# Patient Record
Sex: Female | Born: 1935 | Race: White | Hispanic: No | Smoking: Never smoker
Health system: Southern US, Community
[De-identification: ages and names within clinical notes are randomized; demographics above are authoritative.]

## PROBLEM LIST (undated history)

## (undated) DIAGNOSIS — M8580 Other specified disorders of bone density and structure, unspecified site: Secondary | ICD-10-CM

## (undated) DIAGNOSIS — S9032XA Contusion of left foot, initial encounter: Secondary | ICD-10-CM

## (undated) DIAGNOSIS — M1990 Unspecified osteoarthritis, unspecified site: Secondary | ICD-10-CM

## (undated) DIAGNOSIS — R296 Repeated falls: Secondary | ICD-10-CM

## (undated) DIAGNOSIS — I10 Essential (primary) hypertension: Secondary | ICD-10-CM

## (undated) DIAGNOSIS — D649 Anemia, unspecified: Secondary | ICD-10-CM

## (undated) DIAGNOSIS — R7303 Prediabetes: Secondary | ICD-10-CM

## (undated) DIAGNOSIS — I272 Pulmonary hypertension, unspecified: Secondary | ICD-10-CM

## (undated) DIAGNOSIS — G2581 Restless legs syndrome: Secondary | ICD-10-CM

## (undated) HISTORY — PX: ABDOMINAL HYSTERECTOMY: SHX81

## (undated) HISTORY — PX: ELBOW SURGERY: SHX618

## (undated) HISTORY — DX: Anemia, unspecified: D64.9

## (undated) HISTORY — DX: Unspecified osteoarthritis, unspecified site: M19.90

## (undated) HISTORY — DX: Essential (primary) hypertension: I10

## (undated) HISTORY — DX: Other specified disorders of bone density and structure, unspecified site: M85.80

## (undated) HISTORY — DX: Pulmonary hypertension, unspecified: I27.20

## (undated) HISTORY — DX: Prediabetes: R73.03

## (undated) HISTORY — DX: Restless legs syndrome: G25.81

## (undated) HISTORY — PX: HYSTERECTOMY: SHX81

## (undated) HISTORY — DX: Age-related physical debility: R54

## (undated) HISTORY — PX: CATARACT EXTRACTION: SUR2

---

## 1989-10-21 ENCOUNTER — Emergency Department: Admit: 1989-10-21 | Disposition: A | Discharge status: Home / Self Care | Payer: Self-pay | Source: Ambulatory Visit

## 1992-06-10 ENCOUNTER — Emergency Department: Admit: 1992-06-10 | Disposition: A | Discharge status: Home / Self Care | Payer: Self-pay | Source: Ambulatory Visit

## 1998-04-19 ENCOUNTER — Ambulatory Visit
Admission: RE | Admit: 1998-04-19 | Disposition: A | Discharge status: Home / Self Care | Payer: Self-pay | Source: Ambulatory Visit

## 1998-04-24 ENCOUNTER — Ambulatory Visit
Admission: RE | Admit: 1998-04-24 | Disposition: A | Discharge status: Home / Self Care | Payer: Self-pay | Source: Ambulatory Visit

## 1998-11-09 ENCOUNTER — Emergency Department
Admission: EM | Admit: 1998-11-09 | Disposition: A | Discharge status: Home / Self Care | Payer: Self-pay | Source: Ambulatory Visit

## 2002-10-25 ENCOUNTER — Ambulatory Visit
Admission: RE | Admit: 2002-10-25 | Disposition: A | Discharge status: Home / Self Care | Payer: Self-pay | Source: Ambulatory Visit

## 2002-10-30 ENCOUNTER — Ambulatory Visit
Admission: RE | Admit: 2002-10-30 | Disposition: A | Discharge status: Home / Self Care | Payer: Self-pay | Source: Ambulatory Visit

## 2003-05-10 ENCOUNTER — Ambulatory Visit
Admission: RE | Admit: 2003-05-10 | Disposition: A | Discharge status: Home / Self Care | Payer: Self-pay | Source: Ambulatory Visit

## 2003-11-19 ENCOUNTER — Ambulatory Visit
Admission: RE | Admit: 2003-11-19 | Disposition: A | Discharge status: Home / Self Care | Payer: Self-pay | Source: Ambulatory Visit

## 2004-11-18 ENCOUNTER — Ambulatory Visit
Admission: RE | Admit: 2004-11-18 | Disposition: A | Discharge status: Home / Self Care | Payer: Self-pay | Source: Ambulatory Visit

## 2004-12-09 ENCOUNTER — Ambulatory Visit
Admission: RE | Admit: 2004-12-09 | Disposition: A | Discharge status: Home / Self Care | Payer: Self-pay | Source: Ambulatory Visit

## 2005-07-09 ENCOUNTER — Emergency Department
Admission: EM | Admit: 2005-07-09 | Disposition: A | Discharge status: Home / Self Care | Payer: Self-pay | Source: Ambulatory Visit

## 2005-07-15 ENCOUNTER — Ambulatory Visit
Admission: RE | Admit: 2005-07-15 | Disposition: A | Discharge status: Home / Self Care | Payer: Self-pay | Source: Ambulatory Visit

## 2005-07-15 HISTORY — PX: WRIST FRACTURE SURGERY: SHX121

## 2006-03-30 ENCOUNTER — Ambulatory Visit
Admission: RE | Admit: 2006-03-30 | Disposition: A | Discharge status: Home / Self Care | Payer: Self-pay | Source: Ambulatory Visit

## 2006-06-02 ENCOUNTER — Ambulatory Visit
Admission: RE | Admit: 2006-06-02 | Disposition: A | Discharge status: Home / Self Care | Payer: Self-pay | Source: Ambulatory Visit

## 2006-06-30 ENCOUNTER — Ambulatory Visit
Admission: RE | Admit: 2006-06-30 | Disposition: A | Discharge status: Home / Self Care | Payer: Self-pay | Source: Ambulatory Visit

## 2007-01-18 ENCOUNTER — Ambulatory Visit
Admission: RE | Admit: 2007-01-18 | Disposition: A | Discharge status: Home / Self Care | Payer: Self-pay | Source: Ambulatory Visit

## 2007-06-02 ENCOUNTER — Ambulatory Visit
Admission: RE | Admit: 2007-06-02 | Disposition: A | Discharge status: Home / Self Care | Payer: Self-pay | Source: Ambulatory Visit

## 2007-06-08 ENCOUNTER — Ambulatory Visit
Admission: RE | Admit: 2007-06-08 | Disposition: A | Discharge status: Home / Self Care | Payer: Self-pay | Source: Ambulatory Visit

## 2007-06-20 ENCOUNTER — Ambulatory Visit
Admission: RE | Admit: 2007-06-20 | Disposition: A | Discharge status: Home / Self Care | Payer: Self-pay | Source: Ambulatory Visit

## 2007-08-31 ENCOUNTER — Ambulatory Visit
Admission: RE | Admit: 2007-08-31 | Disposition: A | Discharge status: Home / Self Care | Payer: Self-pay | Source: Ambulatory Visit

## 2007-09-19 ENCOUNTER — Ambulatory Visit
Admission: RE | Admit: 2007-09-19 | Disposition: A | Discharge status: Home / Self Care | Payer: Self-pay | Source: Ambulatory Visit

## 2007-10-13 ENCOUNTER — Ambulatory Visit
Admission: RE | Admit: 2007-10-13 | Disposition: A | Discharge status: Home / Self Care | Payer: Self-pay | Source: Ambulatory Visit

## 2008-08-07 ENCOUNTER — Ambulatory Visit
Admission: RE | Admit: 2008-08-07 | Disposition: A | Discharge status: Home / Self Care | Payer: Self-pay | Source: Ambulatory Visit

## 2009-07-25 ENCOUNTER — Ambulatory Visit
Admission: RE | Admit: 2009-07-25 | Disposition: A | Discharge status: Home / Self Care | Payer: Self-pay | Source: Ambulatory Visit

## 2010-01-30 ENCOUNTER — Ambulatory Visit
Admission: RE | Admit: 2010-01-30 | Disposition: A | Discharge status: Home / Self Care | Payer: Self-pay | Source: Ambulatory Visit

## 2010-10-08 ENCOUNTER — Ambulatory Visit
Admission: RE | Admit: 2010-10-08 | Disposition: A | Discharge status: Home / Self Care | Payer: Self-pay | Source: Ambulatory Visit

## 2012-02-16 ENCOUNTER — Ambulatory Visit
Admission: RE | Admit: 2012-02-16 | Disposition: A | Discharge status: Home / Self Care | Payer: Self-pay | Source: Ambulatory Visit

## 2012-02-21 ENCOUNTER — Emergency Department
Admission: EM | Admit: 2012-02-21 | Disposition: A | Discharge status: Home / Self Care | Payer: Self-pay | Source: Ambulatory Visit

## 2012-04-13 ENCOUNTER — Emergency Department
Admission: EM | Admit: 2012-04-13 | Disposition: A | Discharge status: Home / Self Care | Payer: Self-pay | Source: Ambulatory Visit

## 2012-06-02 ENCOUNTER — Emergency Department
Admission: EM | Admit: 2012-06-02 | Disposition: A | Discharge status: Home / Self Care | Payer: Self-pay | Source: Ambulatory Visit

## 2012-06-21 ENCOUNTER — Ambulatory Visit
Admission: RE | Admit: 2012-06-21 | Disposition: A | Discharge status: Home / Self Care | Payer: Self-pay | Source: Ambulatory Visit

## 2012-07-01 ENCOUNTER — Ambulatory Visit
Admission: RE | Admit: 2012-07-01 | Disposition: A | Discharge status: Home / Self Care | Payer: Self-pay | Source: Ambulatory Visit

## 2012-08-04 ENCOUNTER — Emergency Department
Admission: EM | Admit: 2012-08-04 | Disposition: A | Discharge status: Home / Self Care | Payer: Self-pay | Source: Ambulatory Visit

## 2012-11-09 LAB — COMPREHENSIVE METABOLIC PANEL
ALT: 16 U/L (ref 0–55)
AST (SGOT): 23 U/L (ref 10–42)
Albumin/Globulin Ratio: 1.09 Ratio (ref 0.70–1.50)
Albumin: 3.6 gm/dL (ref 3.5–5.0)
Alkaline Phosphatase: 82 U/L (ref 40–145)
Anion Gap: 16.5 Gap — ABNORMAL HIGH (ref 7.0–16.0)
BUN / Creatinine Ratio: 18.2 Ratio (ref 10.0–30.0)
BUN: 12 mg/dL (ref 7–22)
Bilirubin, Total: 1.4 mg/dL — ABNORMAL HIGH (ref 0.1–1.2)
CO2: 26 mMol/L (ref 20.0–30.0)
Calcium: 10.1 mg/dL (ref 8.5–10.5)
Chloride: 93 mMol/L — ABNORMAL LOW (ref 98–110)
Creatinine: 0.66 mg/dL (ref 0.60–1.20)
EGFR: >60 mL/min/{1.73_m2}
Globulin: 3.3 gm/dL (ref 2.0–4.0)
Glucose: 113 mg/dL — ABNORMAL HIGH (ref 70–99)
Osmolality Calc: 265 mOsm/kg — ABNORMAL LOW (ref 275–300)
Potassium: 3.5 mMol/L (ref 3.5–5.3)
Protein, Total: 6.9 gm/dL (ref 6.0–8.3)
Sodium: 132 mMol/L — ABNORMAL LOW (ref 136–147)

## 2012-11-09 LAB — CBC AND DIFFERENTIAL
Basophils %: 0.5 % (ref 0.0–3.0)
Basophils Absolute: 0 10*3/uL (ref 0.0–0.3)
Eosinophils %: 0.1 % (ref 0.0–7.0)
Eosinophils Absolute: 0 10*3/uL (ref 0.0–0.8)
Hematocrit: 38.7 % (ref 36.0–48.0)
Hemoglobin: 13 gm/dL (ref 12.0–16.0)
Lymphocytes Absolute: 1 10*3/uL (ref 0.6–5.1)
Lymphocytes: 16.6 % (ref 15.0–46.0)
MCH: 33 pg (ref 28–35)
MCHC: 34 gm/dL (ref 33–37)
MCV: 97 fL (ref 80–100)
MPV: 8.7 fL (ref 8.0–12.0)
Monocytes Absolute: 0.6 10*3/uL (ref 0.1–1.7)
Monocytes: 9.9 % (ref 3.0–15.0)
Neutrophils %: 72.9 % (ref 42.0–78.0)
Neutrophils Absolute: 4.3 10*3/uL (ref 1.7–8.6)
PLT CT: 281 10*3/uL (ref 130–440)
RBC: 3.99 10*6/uL (ref 3.80–5.00)
RDW: 13.5 % (ref 12.0–15.0)
WBC: 5.9 10*3/uL (ref 4.0–11.0)

## 2013-01-31 ENCOUNTER — Ambulatory Visit
Admission: RE | Admit: 2013-01-31 | Disposition: A | Discharge status: Home / Self Care | Payer: Self-pay | Source: Ambulatory Visit | Attending: Family Medicine | Admitting: Family Medicine

## 2013-03-08 ENCOUNTER — Encounter (INDEPENDENT_AMBULATORY_CARE_PROVIDER_SITE_OTHER): Payer: Self-pay | Admitting: Orthopaedic Surgery

## 2013-08-23 ENCOUNTER — Emergency Department: Payer: Medicare Other

## 2013-08-23 ENCOUNTER — Emergency Department
Admission: EM | Admit: 2013-08-23 | Discharge: 2013-08-23 | Disposition: A | Discharge status: Home / Self Care | Payer: Medicare Other | Attending: Emergency Medicine | Admitting: Emergency Medicine

## 2013-08-23 DIAGNOSIS — R55 Syncope and collapse: Secondary | ICD-10-CM | POA: Insufficient documentation

## 2013-08-23 DIAGNOSIS — M1990 Unspecified osteoarthritis, unspecified site: Secondary | ICD-10-CM | POA: Insufficient documentation

## 2013-08-23 DIAGNOSIS — I10 Essential (primary) hypertension: Secondary | ICD-10-CM | POA: Insufficient documentation

## 2013-08-23 LAB — ECG 12-LEAD
P Wave Axis: 73 deg
P Wave Duration: 140 ms
P-R Interval: 216 ms
Patient Age: 89 years
Q-T Dispersion: 40 ms
Q-T Interval(Corrected): 409 ms
Q-T Interval: 364 ms
QRS Axis: 45 deg
QRS Duration: 82 ms
T Axis: 68 deg
Ventricular Rate: 76 /min

## 2013-08-23 LAB — COMPREHENSIVE METABOLIC PANEL
ALT: 9 U/L (ref 0–55)
AST (SGOT): 21 U/L (ref 10–42)
Albumin/Globulin Ratio: 0.95 Ratio (ref 0.70–1.50)
Albumin: 3.3 gm/dL — ABNORMAL LOW (ref 3.5–5.0)
Alkaline Phosphatase: 83 U/L (ref 40–145)
Anion Gap: 14.1 mMol/L (ref 7.0–18.0)
BUN / Creatinine Ratio: 15.6 Ratio (ref 10.0–30.0)
BUN: 10 mg/dL (ref 7–22)
Bilirubin, Total: 1 mg/dL (ref 0.1–1.2)
CO2: 26 mMol/L (ref 20.0–30.0)
Calcium: 10.2 mg/dL (ref 8.5–10.5)
Chloride: 99 mMol/L (ref 98–110)
Creatinine: 0.64 mg/dL (ref 0.60–1.20)
EGFR: >60 mL/min/{1.73_m2}
Globulin: 3.5 gm/dL (ref 2.0–4.0)
Glucose: 101 mg/dL — ABNORMAL HIGH (ref 70–99)
Osmolality Calc: 269 mOsm/kg — ABNORMAL LOW (ref 275–300)
Potassium: 4.1 mMol/L (ref 3.5–5.3)
Protein, Total: 6.8 gm/dL (ref 6.0–8.3)
Sodium: 135 mMol/L — ABNORMAL LOW (ref 136–147)

## 2013-08-23 LAB — CBC AND DIFFERENTIAL
Basophils %: 0.6 % (ref 0.0–3.0)
Basophils Absolute: 0 10*3/uL (ref 0.0–0.3)
Eosinophils %: 0.5 % (ref 0.0–7.0)
Eosinophils Absolute: 0 10*3/uL (ref 0.0–0.8)
Hematocrit: 34.4 % — ABNORMAL LOW (ref 36.0–48.0)
Hemoglobin: 11.2 gm/dL — ABNORMAL LOW (ref 12.0–16.0)
Lymphocytes Absolute: 1.2 10*3/uL (ref 0.6–5.1)
Lymphocytes: 24.2 % (ref 15.0–46.0)
MCH: 30 pg (ref 28–35)
MCHC: 33 gm/dL (ref 33–37)
MCV: 93 fL (ref 80–100)
MPV: 9.1 fL (ref 8.0–12.0)
Monocytes Absolute: 0.5 10*3/uL (ref 0.1–1.7)
Monocytes: 10.4 % (ref 3.0–15.0)
Neutrophils %: 64.3 % (ref 42.0–78.0)
Neutrophils Absolute: 3.1 10*3/uL (ref 1.7–8.6)
PLT CT: 300 10*3/uL (ref 130–440)
RBC: 3.7 10*6/uL — ABNORMAL LOW (ref 3.80–5.00)
RDW: 13 % (ref 12.0–15.0)
WBC: 4.8 10*3/uL (ref 4.0–11.0)

## 2013-08-23 LAB — VH CARDIAC PROF.WITH TROPONIN
Creatine Kinase (CK): 49 U/L (ref 30–189)
Creatinine Kinase MB (CKMB): 1.1 ng/mL (ref 0.1–6.0)
Troponin I: 0 ng/mL (ref 0.00–0.02)

## 2013-08-23 NOTE — ED Provider Notes (Signed)
Physician/Midlevel provider first contact with patient: 08/23/13 1037         History     Chief Complaint   Patient presents with   . Fall     HPI  This is a 79 yo female that presents after a fall on Sunday.  She states she fell suddenly without symptoms of dizziness, palpitations or shortness of breath before it.  She has not been sick recently or had any symptoms of UTI like dysuria.  She states she just fell for no reason.  She has never done that before.  She felt about a year ago but it was because she missed a stair.  No fevers or chest pain.  No neck pain.  She just has a mild headache with occipital pain.        Past Medical History   Diagnosis Date   . Arthritis    . Hypertension        Past Surgical History   Procedure Date   . Hysterectomy    . Elbow surgery        Family History   Problem Relation Age of Onset   . Arthritis Mother        Social  History   Substance Use Topics   . Smoking status: Never Smoker    . Smokeless tobacco: Not on file   . Alcohol Use: No       .     No Known Allergies    Current/Home Medications    ASPIRIN EC 81 MG EC TABLET    Take 81 mg by mouth daily.    UNKNOWN TO PATIENT            Review of Systems   Constitutional: Negative.  Negative for fever, chills, activity change and fatigue.   HENT: Negative.    Eyes: Negative.    Respiratory: Negative.  Negative for cough and wheezing.    Cardiovascular: Negative.  Negative for chest pain and palpitations.   Gastrointestinal: Negative.  Negative for nausea, vomiting, abdominal pain, constipation and blood in stool.   Genitourinary: Negative.  Negative for dysuria, hematuria and difficulty urinating.   Musculoskeletal: Negative.  Negative for back pain, gait problem and myalgias.   Skin: Negative.  Negative for rash and wound.   Neurological: Negative.  Negative for dizziness, syncope, weakness, numbness and headaches.   Hematological: Negative.    Psychiatric/Behavioral: Negative.    All other systems reviewed and are  negative.        Physical Exam    BP 133/70  Pulse 85  Temp 97.3 F (36.3 C)  Resp 20  SpO2 99%    Physical Exam   Nursing note and vitals reviewed.  Constitutional: She is oriented to person, place, and time. Vital signs are normal. She appears well-developed and well-nourished. She is active. No distress.   HENT:   Head: Normocephalic and atraumatic.   Right Ear: Hearing, tympanic membrane, external ear and ear canal normal.   Left Ear: Hearing, tympanic membrane, external ear and ear canal normal.   Nose: Nose normal.   Mouth/Throat: Uvula is midline, oropharynx is clear and moist and mucous membranes are normal.   Eyes: Conjunctivae normal and EOM are normal. Pupils are equal, round, and reactive to light.   Neck: Normal range of motion and full passive range of motion without pain. Neck supple. Carotid bruit is not present.   Cardiovascular: Normal rate, regular rhythm, normal heart sounds and intact distal pulses.  No murmur heard.  Pulmonary/Chest: Effort normal and breath sounds normal. No respiratory distress. She has no wheezes. She has no rales.   Abdominal: Soft. Normal appearance and bowel sounds are normal. There is no hepatosplenomegaly. There is no tenderness.   Musculoskeletal: Normal range of motion. She exhibits no tenderness.   Neurological: She is alert and oriented to person, place, and time. She has normal reflexes. She displays normal reflexes. No cranial nerve deficit or sensory deficit. She exhibits normal muscle tone. Coordination normal.   Skin: Skin is warm and dry. No rash noted. She is not diaphoretic.   Psychiatric: She has a normal mood and affect. Her speech is normal and behavior is normal.       MDM and ED Course     ED Medication Orders     None           MDM      Procedures  EKG: 76 bpm NSR  Ct Head Wo Contrast    08/23/2013  Stable mild chronic microvascular ischemic changes noted. No CT evidence of acute infarct or hemorrhage. No evidence of traumatic injury.   [email or web address]     Xr Chest Ap Portable    08/23/2013  No evidence of CHF or pneumonia. No pneumothorax or rib fracture identified.  [email or web address]     Results     Procedure Component Value Units Date/Time    Cardiac Prof.with Troponin [[id number]] Collected:08/23/13 1112    Specimen Information:Plasma Updated:08/23/13 1157     Creatine Kinase (CK) 49 U/L      CKMB Index NI %      Creatinine Kinase MB (CKMB) 1.1 ng/mL      Troponin I 0.00 ng/mL     Comprehensive metabolic panel [[id number]]  (Abnormal) Collected:08/23/13 1112    Specimen Information:Blood / Plasma Updated:08/23/13 1150     Sodium 135 (L) mMol/L      Potassium 4.1 mMol/L      Chloride 99 mMol/L      CO2 26.0 mMol/L      CALCIUM 10.2 mg/dL      Glucose 528 (H) mg/dL      Creatinine 4.13 mg/dL      BUN 10 mg/dL      Protein, Total 6.8 gm/dL      Albumin 3.3 (L) gm/dL      Alkaline Phosphatase 83 U/L      ALT 9 U/L      AST (SGOT) 21 U/L      Bilirubin, Total 1.0 mg/dL      Albumin/Globulin Ratio 0.95 Ratio      Anion Gap 14.1 mMol/L      BUN/Creatinine Ratio 15.6 Ratio      EGFR >60 mL/min/1.28m2      Osmolality Calc 269 (L) mOsm/kg      Globulin 3.5 gm/dL     CBC and differential [[id number]]  (Abnormal) Collected:08/23/13 1112    Specimen Information:Blood / Blood Updated:08/23/13 1129     WBC 4.8 K/cmm      RBC 3.70 (L) M/cmm      Hemoglobin 11.2 (L) gm/dL      Hematocrit 53.6 (L) %      MCV 93 fL      MCH 30 pg      MCHC 33 gm/dL      RDW 64.4 %      PLT CT 300 K/cmm      MPV 9.1 fL      NEUTROPHIL % 64.3 %  Lymphocytes 24.2 %      Monocytes 10.4 %      Eosinophils % 0.5 %      Basophils % 0.6 %      Neutrophils Absolute 3.1 K/cmm      Lymphocytes Absolute 1.2 K/cmm      Monocytes Absolute 0.5 K/cmm      Eosinophils Absolute 0.0 K/cmm      BASO Absolute 0.0 K/cmm             Clinical Impression & Disposition     Clinical Impression  Final diagnoses:   Drop attack   Fall, sequela    1245:patient has declined being admitted to  the hospital.  She declined 3 times.  She understands the risks of going home without a workup.  At this time, patient is stable and her labs were normal.  Will send her home under the advisement of f/u with her PCP on Friday.    I reviewed the patients orthostatics, and they were normal.      ED Disposition     Discharge Darrick Meigs discharge to home/self care.    Condition at disposition: Stable             New Prescriptions    No medications on file                 Salome Spotted, DO  08/23/13 1423

## 2013-08-23 NOTE — Discharge Instructions (Signed)
Diagnosing Syncope  Syncope is loss of consciousness (fainting). Your doctor will ask you about your fainting episode and health history. You will also be examined. You may need one or more tests.  Health History and Exam  You may be asked about:   Where and when you fainted, and how long you were unconscious   How you felt just before and right after you fainted   Any family history of heart disease or fainting   Any cardiac or neurological problems you may have   Any medications you may be taking  Your doctor will examine you. Your doctor may:   Check your blood pressure several times   Listen for any heart murmurs or abnormal heartbeats   Examine your eyes, reflexes, and limb movement  Tests  You may need one of more of the following tests:   Electrocardiogram (ECG). This can help your doctor find a slow or a fast heartbeat.   Holter monitoring. You wear a portable ECG monitor for 24 hours. It records your heartbeat.   Event monitoring.You wear a portable ECG monitor for several weeks. It records your heartbeat.   Echocardiogram. This test takes pictures of your heart. It can show heart valve or heart function problems. Or it can reveal damage from a heart attack.   Electrophysiology studies (EPS).These help your doctor find weak or damaged electrical pathways that make your heart beat too fast or slow. This helps your doctor find the cause of a heart rate problem and decide how to treat it.   Tilt table testing. Tilt table testing helps show if changes in your body position affect your heart rate and blood pressure.   2000-2014 [employer], [address], [place] [address]. All rights reserved. This information is not intended as a substitute for professional medical care. Always follow your healthcare professional's instructions.        Near-Fainting:Uncertain Cause  Fainting (syncope) is a temporary loss of consciousness ("passing out"). It occurs when blood flow to the brain is  reduced. Near-fainting ("near-syncope") is like fainting, but you do not fully "pass out."  The common minor causes of near fainting include sudden fear, pain, emotional stress, overexertion, or quickly standing up after sitting or lying for a long time.  The more serious causes for near fainting are due to either a very slow or very fast heart beat, dehydration, anemia, blood loss, problems related to the heart, or taking too much high blood pressure medicine.  The exact cause of your episode is not certain. More tests may be required. Therefore, it is important that you follow up with your doctor as advised.  Home Care:  1) Rest today. Resume your normal activities as soon as you are feeling back to normal.  2) If you become light-headed or dizzy, lie down right away or sit with your head between your knees.  3) Because we do not know the exact cause of your near fainting spell, another spell could occur without warning. Therefore, do not drive a car or use dangerous equipment. D o not take a bath alone (use a shower instead). Do not swim alone. You can resume these activities when your doctor says that you are no longer in danger of having a near fainting spell.  4) Stay well hydrated by drinking enough fluid each day.  Follow Up  with your doctor as instructed.  Get Prompt Medical Attention  if any of the following occur:  -- Another fainting spell   occurs, and it is not explained by the common causes listed above  -- Chest, arm, neck, jaw, back or abdominal pain  -- Shortness of breath  -- Weakness, tingling or numbness in one side of the face, one arm or leg  -- Slurred speech, confusion, trouble walking or seeing  -- Seizure  -- Blood in vomit, stools (black or red color)  -- (In women) unexpected vaginal bleeding   2000-2014 [employer], [address], [place] [address]. All rights reserved. This information is not intended as a substitute for professional medical care. Always follow your  healthcare professional's instructions.

## 2013-08-23 NOTE — ED Provider Notes (Signed)
I have reviewed the notes, assessments, and/or procedures performed by Stacy Gardner, DO, I concur with her/his documentation of Carla Cannon.    I have seen and examined this patient personally.      Carla Ducking, MD  08/23/13 [phone]

## 2014-03-23 ENCOUNTER — Encounter (INDEPENDENT_AMBULATORY_CARE_PROVIDER_SITE_OTHER): Payer: Self-pay | Admitting: Orthopaedic Surgery

## 2014-03-23 ENCOUNTER — Ambulatory Visit (INDEPENDENT_AMBULATORY_CARE_PROVIDER_SITE_OTHER): Payer: Medicare Other | Admitting: Orthopaedic Surgery

## 2014-03-23 VITALS — BP 129/69 | HR 79

## 2014-03-23 DIAGNOSIS — M19012 Primary osteoarthritis, left shoulder: Secondary | ICD-10-CM

## 2014-03-23 DIAGNOSIS — M19019 Primary osteoarthritis, unspecified shoulder: Secondary | ICD-10-CM

## 2014-03-23 MED ORDER — BETAMETHASONE SOD PHOS & ACET 6 (3-3) MG/ML IJ SUSP
12.0000 mg | Freq: Once | INTRAMUSCULAR | Status: DC
Start: ? — End: 2014-03-23
  Administered 2014-03-23: 12 mg via INTRA_ARTICULAR

## 2014-03-23 MED ORDER — LIDOCAINE HCL (PF) 1 % IJ SOLN
2.0000 mL | Freq: Once | INTRAMUSCULAR | Status: DC
Start: ? — End: 2014-03-23
  Administered 2014-03-23: 10:00:00 2 mL via INTRA_ARTICULAR

## 2014-03-23 NOTE — Progress Notes (Signed)
Progress Note      Chief Complaint   Patient presents with   . Shoulder Pain     bilateral L>R       Subjective/HPI Comments:    Patient is a  80 y.o. female who presents with bilateral shoulder pain, L>R. She was last seen for this 2.11.2014 and received Injection betamethasone 12 mg with lidocaine 2ml left shoulder,.      Date of Injury: na  Date Last Seen:  2.11.2014 bilateral shoulder pain L injection    Pain level today on a scale of 0-10, with 10 being the highest, is     Pain Quality:    __x_ Aching    ___ Burning    ___ Shooting    ___ Stabbing    ___ Other:     Pain Course:       _x__ Constant    ___ Intermittent    x___ Fluctuating    ___ Improving    ___ Worsening    Associated Symptoms:        ___ Numbness      _x__ Weakness      _x__ Decreased Range of Motion      _x__ Stiffness      _x__ Pain with Activity      __x_ Night Pain      ___ Locking and Catching      ___ Other:    Previous Treatment:      ___ Rest      ___ Ice      __x_ Heat      _x__ Tylenol      ___ Anti-inflammatory medication       ___ Other:    Patient lives with self, good support from family  Patient works at    Outpatient Prescriptions Marked as Taking for the 03/23/14 encounter (Office Visit) with Berniece Pap, MD   Medication Sig Dispense Refill   . lisinopril-hydrochlorothiazide (PRINZIDE,ZESTORETIC) 20-12.5 MG per tablet     3   . rOPINIRole (REQUIP) 0.5 MG tablet Take 1 mg by mouth nightly.          Marland Kitchen UNKNOWN TO PATIENT          Current Facility-Administered Medications for the 03/23/14 encounter (Office Visit) with Berniece Pap, MD   Medication Dose Route Frequency Provider Last Rate Last Dose   . [COMPLETED] betamethasone acetate-betamethasone sodium phosphate (CELESTONE) injection 12 mg  12 mg Intra-articular Once Berniece Pap, MD   12 mg at 03/23/14 0931   . [COMPLETED] lidocaine (XYLOCAINE) 1 % injection 2 mL  2 mL Intra-articular Once Berniece Pap, MD   2 mL at 03/23/14 0932       Review of Systems:  Review of  Systems   HENT: Positive for hearing loss.    Eyes: Positive for visual disturbance.   Respiratory: Negative for shortness of breath.    Cardiovascular: Negative for chest pain, palpitations and leg swelling.   Gastrointestinal: Negative for abdominal pain and diarrhea.   Genitourinary: Negative for urgency.   Musculoskeletal: Negative for joint swelling and gait problem.   Neurological: Negative for seizures, syncope and headaches.   Psychiatric/Behavioral: Negative for dysphoric mood. The patient is nervous/anxious.             Information above has been gathered by office staff and reviewed and amended by me as needed.  The following portions of the patient's history were reviewed and updated as appropriate: allergies, current medications, past  family history, past medical history, past social history, past surgical history, and problem list.    Objective/ Physical Exam:    BP 129/69   Pulse 79   Patient is alert and oriented.   The skin at the affected area is intact   Pain with any motion of the left shoulder  Stable      Assessment:    1. Arthritis of left shoulder region        Plan:    Shoulder Injection Note    Patient's name and date of birth were verified, site of injection was verified, verified that all of the correct equipment was in the room, and consent was obtained.  A "Time Out" was performed to confirm the above.   The patient was positioned supine with the assistant holding the forearm in the vertical position.  The left shoulder was prepped with betadine. The needle position was confirmed by gentle shoulder rotation during the injection.  2 mL of Lidocaine and 12 mg of Betamethasone was injected 1 cm distal and 1 cm lateral to the coracoid process with the needle directed to the intra-articular space under sterile technique, with no complications.          The patient has been informed of all ordered tests and/or consults, if applicable.  All patient concerns and questions have been addressed.     Work Status:  Follow up: 3 weeks for the other side

## 2014-04-17 ENCOUNTER — Encounter (INDEPENDENT_AMBULATORY_CARE_PROVIDER_SITE_OTHER): Payer: Self-pay | Admitting: Orthopaedic Surgery

## 2014-04-17 ENCOUNTER — Ambulatory Visit (INDEPENDENT_AMBULATORY_CARE_PROVIDER_SITE_OTHER): Payer: Medicare Other | Admitting: Orthopaedic Surgery

## 2014-04-17 VITALS — BP 143/72 | HR 86

## 2014-04-17 DIAGNOSIS — M19011 Primary osteoarthritis, right shoulder: Secondary | ICD-10-CM

## 2014-04-17 DIAGNOSIS — M19012 Primary osteoarthritis, left shoulder: Secondary | ICD-10-CM

## 2014-04-17 DIAGNOSIS — M19019 Primary osteoarthritis, unspecified shoulder: Secondary | ICD-10-CM

## 2014-04-17 MED ORDER — BETAMETHASONE SOD PHOS & ACET 6 (3-3) MG/ML IJ SUSP
12.0000 mg | Freq: Once | INTRAMUSCULAR | Status: DC
Start: ? — End: 2014-04-17
  Administered 2014-04-17: 12 mg via INTRA_ARTICULAR

## 2014-04-17 MED ORDER — LIDOCAINE HCL (PF) 1 % IJ SOLN
2.0000 mL | Freq: Once | INTRAMUSCULAR | Status: DC
Start: ? — End: 2014-04-17
  Administered 2014-04-17: 2 mL via INTRA_ARTICULAR

## 2014-04-17 NOTE — Progress Notes (Signed)
Progress Note      Chief Complaint   Patient presents with   . Shoulder Pain     right       Subjective/HPI Comments:    Patient is a  80 y.o. female who presents with right shoulder pain.  She has a constant pain in her right shoulder and is here today to discuss an injection.   On 03/23/2014 she was here and had an injection of 2 mL of Lidocaine and 12 mg of Betamethasone in her left shoulder.     Date of Injury:  Date Last Seen:03/23/2014.    Pain level today on a scale of 0-10, with 10 being the highest, is 4.    Patient lives with self, has support from her family.   Patient does not work.    Outpatient Prescriptions Marked as Taking for the 04/17/14 encounter (Office Visit) with Berniece Pap, MD   Medication Sig Dispense Refill   . aspirin EC 81 MG EC tablet Take 81 mg by mouth daily.     Marland Kitchen gabapentin (NEURONTIN) 100 MG capsule   0   . lisinopril-hydrochlorothiazide (PRINZIDE,ZESTORETIC) 20-12.5 MG per tablet   3   . nystatin (NYSTOP) 100000 UNIT/GM Powder   1   . rOPINIRole (REQUIP) 0.5 MG tablet Take 1 mg by mouth nightly.        Marland Kitchen UNKNOWN TO PATIENT          Review of Systems:  HENT: Positive for hearing loss.    Eyes: Positive for visual disturbance.   Respiratory: Negative for shortness of breath.    Cardiovascular: Negative for chest pain, palpitations and leg swelling.   Gastrointestinal: Negative for abdominal pain and diarrhea.   Genitourinary: Negative for urgency.   Musculoskeletal: Negative for joint swelling and gait problem.   Neurological: Negative for seizures, syncope and headaches.     Patients states she is "shaky" today.   Psychiatric/Behavioral: Negative for dysphoric mood. The patient is nervous/anxious    Information above has been gathered by office staff and reviewed and amended by me as needed.  The following portions of the patient's history were reviewed and updated as appropriate: allergies, current medications, past family history, past medical history, past social history, past  surgical history, and problem list.    Objective/ Physical Exam:    BP 143/72 mmHg  Pulse 86  Patient is alert and oriented.   The skin at the affected area is intact.  She can reach the top of the head with both hands with only minor discomfort, a real improvement on the injected left side.  Flexion 135, abduction 80, internal rotation thumb to the pelvic crest on the right side    Assessment:    1. Arthritis of left shoulder region    2. Arthritis of right shoulder region        Plan:    Shoulder Injection Note    Patient's name and date of birth were verified, site of injection was verified, verified that all of the correct equipment was in the room, and consent was obtained.  A "Time Out" was performed to confirm the above.   The patient was positioned supine with the assistant holding the forearm in the vertical position.  The right shoulder was prepped with betadine. The needle position was confirmed by gentle shoulder rotation during the injection.  2 mL of Lidocaine and 12 mg of Betamethasone was injected 1 cm distal and 1 cm lateral to the coracoid process  with the needle directed to the intra-articular space under sterile technique, with no complications.      The patient has been informed of all ordered tests and/or consults, if applicable.  All patient concerns and questions have been addressed.    Work Status:  Follow up: prn

## 2014-04-23 ENCOUNTER — Emergency Department
Admission: EM | Admit: 2014-04-23 | Discharge: 2014-04-23 | Disposition: A | Discharge status: Home / Self Care | Payer: Medicare Other | Attending: Emergency Medicine | Admitting: Emergency Medicine

## 2014-04-23 ENCOUNTER — Emergency Department: Payer: Medicare Other

## 2014-04-23 DIAGNOSIS — H9190 Unspecified hearing loss, unspecified ear: Secondary | ICD-10-CM | POA: Insufficient documentation

## 2014-04-23 DIAGNOSIS — H9209 Otalgia, unspecified ear: Secondary | ICD-10-CM | POA: Insufficient documentation

## 2014-04-23 DIAGNOSIS — H9192 Unspecified hearing loss, left ear: Secondary | ICD-10-CM

## 2014-04-23 DIAGNOSIS — H9202 Otalgia, left ear: Secondary | ICD-10-CM

## 2014-04-23 MED ORDER — OFLOXACIN 0.3 % OT SOLN
5.0000 [drp] | Freq: Two times a day (BID) | OTIC | Status: AC
Start: 2014-04-23 — End: 2014-05-03

## 2014-04-23 NOTE — ED Provider Notes (Signed)
Physician/Midlevel provider first contact with patient: 04/23/14 1125         History     Chief Complaint   Patient presents with   . Otalgia     This patient started on Saturday with pain in her left ear and decreased hearing. There is no dizziness there is no buzzing. There's been no drainage. There's been no trauma. She denies any URI runny nose or sore throat. There is no pain to open and close her mouth. There is no dental pain.        Past Medical History   Diagnosis Date   . Arthritis    . Hypertension    . Osteopenia    . Arthritis    . Osteoarthritis    . Pulmonary HTN    . Prediabetes    . RLS (restless legs syndrome)    . Anemia        Past Surgical History   Procedure Laterality Date   . Hysterectomy     . Elbow surgery     . Cataract extraction     . Wrist fracture surgery Left 10.4.2006       Family History   Problem Relation Age of Onset   . Arthritis Mother        Social  History   Substance Use Topics   . Smoking status: Never Smoker    . Smokeless tobacco: Not on file   . Alcohol Use: No       .     Allergies   Allergen Reactions   . Cephalosporins Photosensitivity   . Ciprocin-Fluocin-Procin [Fluocinolone] Photosensitivity   . Codeine Nausea Only   . Penicillins Nausea Only   . Ketoconazole Rash       Current/Home Medications    ASPIRIN EC 81 MG EC TABLET    Take 81 mg by mouth daily.    GABAPENTIN (NEURONTIN) 100 MG CAPSULE        LISINOPRIL-HYDROCHLOROTHIAZIDE (PRINZIDE,ZESTORETIC) 20-12.5 MG PER TABLET        NYSTATIN (NYSTOP) 100000 UNIT/GM POWDER        ROPINIROLE (REQUIP) 0.5 MG TABLET    Take 1 mg by mouth nightly.       UNKNOWN TO PATIENT            Review of Systems   Constitutional: Negative for fever.   HENT: Positive for ear pain and hearing loss. Negative for congestion, ear discharge, postnasal drip, rhinorrhea, sinus pressure, tinnitus and trouble swallowing.    Respiratory: Negative for cough.    Neurological: Negative for dizziness.       Physical Exam    BP: 120/71 mmHg, Heart  Rate: 97, Temp: 97.5 F (36.4 C), Resp Rate: 18, SpO2: 100 %, Weight: 55.339 kg     Physical Exam   Constitutional: She appears well-developed and well-nourished.   HENT:   Head: Normocephalic.   Nose: Nose normal.   Mouth/Throat: Oropharynx is clear and moist.   Patient cannot hear me rubbing my fingers from the left ear.  The pinna appears normal  There is some cerumen adherent to the external portion of the external canal  Ears no purulent discharge  tHe external canal is tender  The tympanic membrane appears normal  no mastoid tenderness   Eyes: Pupils are equal, round, and reactive to light.   Neck: Normal range of motion. Neck supple.   Cardiovascular: Normal rate, regular rhythm and normal heart sounds.    Pulmonary/Chest: Effort normal and  breath sounds normal.   Lymphadenopathy:     She has no cervical adenopathy.   Nursing note and vitals reviewed.      MDM and ED Course     ED Medication Orders    None       I will give the patient oxygen otic drops since the only area I can find that is tender is the external canal. We have made an appointment with her to see Dr.Hsu on Wednesday at 9 AM she is to make an appointment with [facility] for hearing test before    MDM      Procedures    Clinical Impression & Disposition     Clinical Impression  Final diagnoses:   Otalgia of left ear   Hearing loss in left ear        ED Disposition    Discharge Darrick Meigs discharge to home/self care.    Condition at disposition: Stable             New Prescriptions    OFLOXACIN (FLOXIN) 0.3 % OTIC SOLUTION    Place 5 drops into the left ear 2 (two) times daily. In affected ear               Manning Charity, MD  04/23/14 [phone]

## 2014-04-23 NOTE — ED Notes (Signed)
I am calling Dr. Raynald Kemp office for follow up appointment for this week. April 25, 2014 Wednesday 09:00 am.  If she goes ahead and makes an appointment with [facility] Canning for hearing test she will have not charge for the office visit for Dr. Raynald Kemp.

## 2014-04-25 ENCOUNTER — Ambulatory Visit (INDEPENDENT_AMBULATORY_CARE_PROVIDER_SITE_OTHER): Payer: Medicare Other | Admitting: Otolaryngology

## 2014-11-28 ENCOUNTER — Emergency Department: Payer: Medicare Other

## 2014-11-28 ENCOUNTER — Emergency Department
Admission: EM | Admit: 2014-11-28 | Discharge: 2014-11-28 | Disposition: A | Discharge status: Home / Self Care | Payer: Medicare Other | Attending: Emergency Medicine | Admitting: Emergency Medicine

## 2014-11-28 DIAGNOSIS — L03211 Cellulitis of face: Secondary | ICD-10-CM | POA: Insufficient documentation

## 2014-11-28 DIAGNOSIS — J340 Abscess, furuncle and carbuncle of nose: Secondary | ICD-10-CM | POA: Insufficient documentation

## 2014-11-28 LAB — COMPREHENSIVE METABOLIC PANEL
ALT: 12 U/L (ref 0–55)
AST (SGOT): 21 U/L (ref 10–42)
Albumin/Globulin Ratio: 1.02 Ratio (ref 0.70–1.50)
Albumin: 3.6 gm/dL (ref 3.5–5.0)
Alkaline Phosphatase: 116 U/L (ref 40–145)
Anion Gap: 12 mMol/L (ref 7.0–18.0)
BUN / Creatinine Ratio: 17.1 Ratio (ref 10.0–30.0)
BUN: 14 mg/dL (ref 7–22)
Bilirubin, Total: 0.9 mg/dL (ref 0.1–1.2)
CO2: 29 mMol/L (ref 20.0–30.0)
Calcium: 9.9 mg/dL (ref 8.5–10.5)
Chloride: 104 mMol/L (ref 98–110)
Creatinine: 0.82 mg/dL (ref 0.60–1.20)
EGFR: >60 mL/min/{1.73_m2}
Globulin: 3.6 gm/dL (ref 2.0–4.0)
Glucose: 106 mg/dL — ABNORMAL HIGH (ref 70–99)
Osmolality Calc: 282 mOsm/kg (ref 275–300)
Potassium: 4 mMol/L (ref 3.5–5.3)
Protein, Total: 7.2 gm/dL (ref 6.0–8.3)
Sodium: 141 mMol/L (ref 136–147)

## 2014-11-28 LAB — CBC AND DIFFERENTIAL
Basophils %: 0.9 % (ref 0.0–3.0)
Basophils Absolute: 0.1 10*3/uL (ref 0.0–0.3)
Eosinophils %: 1.8 % (ref 0.0–7.0)
Eosinophils Absolute: 0.1 10*3/uL (ref 0.0–0.8)
Hematocrit: 41.1 % (ref 36.0–48.0)
Hemoglobin: 13.4 gm/dL (ref 12.0–16.0)
Lymphocytes Absolute: 1.5 10*3/uL (ref 0.6–5.1)
Lymphocytes: 22.6 % (ref 15.0–46.0)
MCH: 30 pg (ref 28–35)
MCHC: 33 gm/dL (ref 33–37)
MCV: 92 fL (ref 80–100)
MPV: 10.3 fL (ref 8.0–12.0)
Monocytes Absolute: 0.5 10*3/uL (ref 0.1–1.7)
Monocytes: 7.4 % (ref 3.0–15.0)
Neutrophils %: 67.3 % (ref 42.0–78.0)
Neutrophils Absolute: 4.5 10*3/uL (ref 1.7–8.6)
PLT CT: 268 10*3/uL (ref 130–440)
RBC: 4.49 10*6/uL (ref 3.80–5.00)
RDW: 15.1 % — ABNORMAL HIGH (ref 12.0–15.0)
WBC: 6.6 10*3/uL (ref 4.0–11.0)

## 2014-11-28 MED ORDER — SODIUM CHLORIDE 0.9 % IV MBP
500.0000 mg | Freq: Four times a day (QID) | INTRAVENOUS | Status: DC
Start: 2014-11-28 — End: 2014-11-28
  Administered 2014-11-28: 500 mg via INTRAVENOUS
  Filled 2014-11-28 (×5): qty 500

## 2014-11-28 MED ORDER — VANCOMYCIN HCL 1000 MG IV SOLR
INTRAVENOUS | Status: AC
Start: 2014-11-28 — End: ?
  Filled 2014-11-28: qty 1000

## 2014-11-28 MED ORDER — LET SOLUTION
Freq: Once | TOPICAL | Status: AC
Start: 2014-11-28 — End: 2014-11-28
  Administered 2014-11-28: 3 mL via TOPICAL

## 2014-11-28 MED ORDER — LET SOLUTION
TOPICAL | Status: AC
Start: 2014-11-28 — End: ?
  Filled 2014-11-28: qty 3

## 2014-11-28 MED ORDER — CEPHALEXIN 250 MG PO CAPS
250.0000 mg | ORAL_CAPSULE | Freq: Four times a day (QID) | ORAL | Status: DC
Start: 2014-11-28 — End: 2015-09-09

## 2014-11-28 MED ORDER — VANCOMYCIN HCL 1000 MG IV SOLR
1.0000 g | Freq: Once | INTRAVENOUS | Status: AC
Start: 2014-11-28 — End: 2014-11-28
  Administered 2014-11-28: 1 g via INTRAVENOUS

## 2014-11-28 NOTE — ED Provider Notes (Signed)
Physician/Midlevel provider first contact with patient: 11/28/14 1142           [facility]  Emergency Department History and Physical       Clinical Impression  1. Nasal abscess    2. Facial cellulitis        Disposition  ED Disposition     Discharge Carla Cannon discharge to home/self care.    Condition at disposition: Stable              Date: 11/28/2014  Patient Name: Carla Cannon  Attending Physician: Nicolasa Ducking, MD  Patient DOB:  March 18, 1920  MRN:  [id number]  Room:  1/ED1-A    Patient was evaluated by ED physician, Nicolasa Ducking, MD     at 11:42 AM.    History of Present Illness     Chief Complaint   Patient presents with   . Eye Problem     History provided by: Patient    Carla Cannon is a 81 y.o. female with lesion to the left side of the nose near the eye for 1 week. Denies pain or drainage. Complains of pruritis. No rhinorrhea, epistaxis, or change in vision. Nose has been irritated from her eye glasses.    PMD: Rhetta Mura, NP  Review of Systems     Constitutional:  Denies fever, chills, weakness  ENT: No sore throat, dysphagia, ear pain or difficulty hearing.  CV:  No chest pain or palpitations.  Resp:  No dyspnea, cough or hemoptysis.  GI: No abdominal pain, flank pain, nausea, vomiting, diarrhea, constipation,  hematemesis, melena or hematochezia.  GU: No dysuria, hematuria, vaginal bleeding or discharge.  MS: No neck pain, back pain, myalgias, or arthralgias.  Skin: No rash.  Neuro:  No headache, arm or leg weakness or numbness.  Psych:  No behavior changes  Past Medical History     Past Medical History   Diagnosis Date   . Arthritis    . Hypertension    . Osteopenia    . Arthritis    . Osteoarthritis    . Pulmonary HTN    . Prediabetes    . RLS (restless legs syndrome)    . Anemia      Past Surgical History   Procedure Laterality Date   . Hysterectomy     . Elbow surgery     . Cataract extraction     . Wrist fracture surgery Left 10.4.2006     Family History    Problem Relation Age of Onset   . Arthritis Mother      History     Social History   . Marital Status: Widowed     Spouse Name: N/A     Number of Children: N/A   . Years of Education: N/A     Social History Main Topics   . Smoking status: Never Smoker    . Smokeless tobacco: Not on file   . Alcohol Use: No   . Drug Use: No   . Sexual Activity: Not on file     Other Topics Concern   . Not on file     Social History Narrative     Allergies   Allergen Reactions   . Cephalosporins Photosensitivity   . Ciprocin-Fluocin-Procin [Fluocinolone] Photosensitivity   . Codeine Nausea Only   . Penicillins Nausea Only   . Ketoconazole Rash       Current Medications     Current Outpatient Rx  Name  Route  Sig  Dispense  Refill   . aspirin EC 81 MG EC tablet    Oral    Take 81 mg by mouth daily.             Marland Kitchen gabapentin (NEURONTIN) 100 MG capsule                0     . lisinopril-hydrochlorothiazide (PRINZIDE,ZESTORETIC) 20-12.5 MG per tablet                3     . rOPINIRole (REQUIP) 0.5 MG tablet    Oral    Take 1 mg by mouth nightly.                . cephALEXin (KEFLEX) 250 MG capsule    Oral    Take 1 capsule (250 mg total) by mouth 4 (four) times daily.    20 capsule    0     . nystatin (NYSTOP) 100000 UNIT/GM Powder                1     . UNKNOWN TO PATIENT                        Physical Exam     ED Triage Vitals   Enc Vitals Group      BP 11/28/14 1052 143/77 mmHg      Heart Rate 11/28/14 1052 72      Resp Rate 11/28/14 1052 18      Temp 11/28/14 1052 97.5 F (36.4 C)      Temp src --       SpO2 11/28/14 1052 98 %      Weight 11/28/14 1052 55.792 kg      Height 11/28/14 1052 1.575 m                                 Constitutional: Alert, mild acute distress.   Head: Normocephalic, atraumatic  Eyes: Conjunctiva and sclera are normal.  No injection or discharge. Little erythema in the medial epicanthal fold. No upper or lower lid edema.   Ears, Nose, Throat:  Normal external examination of the nose and ears. Left side of  the bridge of the nose has a 6 mm x 4 mm fluctuant mass. Surrounding the left bridge of the nose is erythema, with a little increased warmth. Erythema extends all the way down her maxilla down below the nose height. Small pustule on the left tip of her nose. No lesions to the nasal mucosa.  Neck: Normal range of motion. Trachea midline.  No lymphadenopathy.  Respiratory/Chest: Clear to auscultation. No respiratory distress.   Cardiovascular: Regular rate and rhythm. No murmurs.  Skin: Warm and dry. No rash.  Psychiatric:  Normal affect.  Normal insight  Neuro: CN's grossly intact, conversation in on topic with clear speech, moves all four extremities.  ED Medications Administered     Medications   vancomycin (VANCOCIN) 1 g in sodium chloride 0.9 % 250 mL IVPB (vial-mate) (1 g Intravenous New Bag 11/28/14 1304)   LET solution (lidocaine-epi-tetracaine) (3 mLs Topical Given 11/28/14 1206)     Orders Placed During this Encounter     Orders Placed This Encounter   Procedures   . Wound Culture and Smear   . CBC   . CMP   . Saline lock IV  Data Review     Nursing records reviewed and agree: Yes    Diagnostic Study Results     Labs     Results     Procedure Component Value Units Date/Time    CMP [[id number]]  (Abnormal) Collected:  11/28/14 1205    Specimen Information:  Plasma Updated:  11/28/14 1231     Sodium 141 mMol/L      Potassium 4.0 mMol/L      Chloride 104 mMol/L      CO2 29.0 mMol/L      CALCIUM 9.9 mg/dL      Glucose 409 (H) mg/dL      Creatinine 8.11 mg/dL      BUN 14 mg/dL      Protein, Total 7.2 gm/dL      Albumin 3.6 gm/dL      Alkaline Phosphatase 116 U/L      ALT 12 U/L      AST (SGOT) 21 U/L      Bilirubin, Total 0.9 mg/dL      Albumin/Globulin Ratio 1.02 Ratio      Anion Gap 12.0 mMol/L      BUN/Creatinine Ratio 17.1 Ratio      EGFR >60 mL/min/1.74m2      Osmolality Calc 282 mOsm/kg      Globulin 3.6 gm/dL     CBC [[id number]]  (Abnormal) Collected:  11/28/14 1205    Specimen Information:  Blood / Blood  Updated:  11/28/14 1223     WBC 6.6 K/cmm      RBC 4.49 M/cmm      Hemoglobin 13.4 gm/dL      Hematocrit 21.3 %      MCV 92 fL      MCH 30 pg      MCHC 33 gm/dL      RDW 08.6 (H) %      PLT CT 268 K/cmm      MPV 10.3 fL      NEUTROPHIL % 67.3 %      Lymphocytes 22.6 %      Monocytes 7.4 %      Eosinophils % 1.8 %      Basophils % 0.9 %      Neutrophils Absolute 4.5 K/cmm      Lymphocytes Absolute 1.5 K/cmm      Monocytes Absolute 0.5 K/cmm      Eosinophils Absolute 0.1 K/cmm      BASO Absolute 0.1 K/cmm         Course Notes, Procedures and MDM   Incision and Drainage Procedure Note    Consent: verbal   Time performed: 12:42 PM  Performed by: Myself  Time out was performed. The patient was identified by medical record number and confirming the patient's name. The side and location of abscess was verified.  Sterile procedures observed using Shur-Clens, gloves and sterile drape.    Location: Left nasal bridge  Local Anesthesia: Topical with Topical LET was used to obtain local anesthetic effect.    Procedure: Incision was made with a #11 scalpel over area of fluctuance.   Scant amounts of pus was drained.  Abscess was explored for loculations and completely evacuated. . Neurovascular status normal after procedure. Patient tolerated the procedure well. No complications.    1443: Patient now with redness across her forehead near her hairline. Will hold Vancomycin for about 15 minutes to monitor. Believe this is a form of Red Man's Syndrome.   1529: Erythema improved across the forehead. No  change in how patient feels. She will return tomorrow for recheck.   Diagnosis and Disposition     Clinical Impression  1. Nasal abscess    2. Facial cellulitis        Disposition  ED Disposition     Discharge Carla Cannon discharge to home/self care.    Condition at disposition: Stable            Follow Up  [facility] Emergency Department  [address] [place] [address]  [phone]  In 1 day  after  11am      Prescriptions    Discharge Medication List as of 11/28/2014  3:30 PM      START taking these medications    Details   cephALEXin (KEFLEX) 250 MG capsule Take 1 capsule (250 mg total) by mouth 4 (four) times daily., Starting 11/28/2014, Until Discontinued, Print                  THIS CHART WAS DOCUMENTED BY ASHLEY WILLIAMS, SCRIBE.               Nicolasa Ducking, MD  11/28/14 [phone]

## 2014-11-28 NOTE — Discharge Instructions (Signed)
Discharge Instructions for Cellulitis  You have been diagnosed with cellulitis. This is an infection in the deepest layer of the skin. In some cases, the infection also affects the muscle. Cellulitis is caused by bacteria. The bacteria canenter the body through broken skin. This can happen with a cut, scratch, animal bite, or an insect bite that has been scratched. You may have been treated in the hospital with antibiotics and fluids. You will likely be given a prescription for antibiotics to take at home. This sheet will help you take care of yourself at home.  Home Care  When you are home:   Take the prescribed antibiotic medication you are given as directed until it is gone. Take it even if you feel better. It treats the infection and stops it from returning. Not taking all of the medication can make future infections hard to treat.   Keep the infected area clean.   When possible, raise the infected area above the level of your heart. This helps keep swelling down.   Talk to your doctor if you are in pain. Ask what kind of over-the-counter medication you can take for pain.   Apply clean bandages as advised.   Take your temperature once a day for a week.   Wash your hands often to prevent spreading the infection.  In the future, wash your hands before and after you touch cuts, scratches, or bandages. This will help prevent infection.     2000-2015 [employer] [address], [place] [address]. All rights reserved. This information is not intended as a substitute for professional medical care. Always follow your healthcare professional's instructions.

## 2014-11-28 NOTE — ED Notes (Signed)
Cyst noted on left side of nose very red and tender with itching. X 1 week

## 2014-11-29 ENCOUNTER — Emergency Department
Admission: EM | Admit: 2014-11-29 | Discharge: 2014-11-29 | Disposition: A | Discharge status: Home / Self Care | Payer: Medicare Other | Attending: Emergency Medicine | Admitting: Emergency Medicine

## 2014-11-29 ENCOUNTER — Emergency Department: Payer: Medicare Other

## 2014-11-29 DIAGNOSIS — L03211 Cellulitis of face: Secondary | ICD-10-CM | POA: Insufficient documentation

## 2014-11-29 MED ORDER — VANCOMYCIN HCL 1000 MG IV SOLR
INTRAVENOUS | Status: AC
Start: 2014-11-29 — End: ?
  Filled 2014-11-29: qty 1000

## 2014-11-29 MED ORDER — SODIUM CHLORIDE 0.9 % IV MBP
500.0000 mg | Freq: Four times a day (QID) | INTRAVENOUS | Status: DC
Start: 2014-11-29 — End: 2014-11-29
  Administered 2014-11-29: 500 mg via INTRAVENOUS
  Filled 2014-11-29 (×5): qty 500

## 2014-11-29 MED ORDER — VANCOMYCIN HCL 1000 MG IV SOLR
1.0000 g | Freq: Once | INTRAVENOUS | Status: AC
Start: 2014-11-29 — End: 2014-11-29
  Administered 2014-11-29: 1 g via INTRAVENOUS

## 2014-11-29 NOTE — ED Provider Notes (Signed)
Physician/Midlevel provider first contact with patient: 11/29/14 1241           [facility]  Emergency Department History and Physical       Clinical Impression  1. Cellulitis of face        Disposition  ED Disposition     Discharge Carla Cannon discharge to home/self care.    Condition at disposition: Stable              Date: 11/29/2014  Patient Name: Carla Cannon  Attending Physician: Nicolasa Ducking, MD  Patient DOB:  1920/09/20  MRN:  [id number]  Room:  14/EDA14-A    Patient was evaluated by ED physician, Nicolasa Ducking, MD     at 12:41 PM.    History of Present Illness     Chief Complaint   Patient presents with   . IV Medication     History provided by: Patient    Carla Cannon is a 81 y.o. female with recheck of facial abscess and repeat IV antibiotic treatment. Patient feels subjectively better. Decreased redness. No fever, chills, nausea, or vomiting.     PMD: Rhetta Mura, NP  Review of Systems     ENT: No sore throat or congestion.  Resp:  No dyspnea or hemoptysis.  GI: No diarrhea or hematochezia.  MS: No neck pain, back pain, myalgias.  Skin: No rash.  Past Medical History     Past Medical History   Diagnosis Date   . Arthritis    . Hypertension    . Osteopenia    . Arthritis    . Osteoarthritis    . Pulmonary HTN    . Prediabetes    . RLS (restless legs syndrome)    . Anemia      Past Surgical History   Procedure Laterality Date   . Hysterectomy     . Elbow surgery     . Cataract extraction     . Wrist fracture surgery Left 10.4.2006     Family History   Problem Relation Age of Onset   . Arthritis Mother      History     Social History   . Marital Status: Widowed     Spouse Name: N/A     Number of Children: N/A   . Years of Education: N/A     Social History Main Topics   . Smoking status: Never Smoker    . Smokeless tobacco: Not on file   . Alcohol Use: No   . Drug Use: No   . Sexual Activity: Not on file     Other Topics Concern   . Not on file     Social History  Narrative     Allergies   Allergen Reactions   . Cephalosporins Photosensitivity   . Ciprocin-Fluocin-Procin [Fluocinolone] Photosensitivity   . Codeine Nausea Only   . Penicillins Nausea Only   . Ketoconazole Rash       Current Medications     Current Outpatient Rx   Name  Route  Sig  Dispense  Refill   . aspirin EC 81 MG EC tablet    Oral    Take 81 mg by mouth daily.             . cephALEXin (KEFLEX) 250 MG capsule    Oral    Take 1 capsule (250 mg total) by mouth 4 (four) times daily.    20 capsule    0     .  gabapentin (NEURONTIN) 100 MG capsule                0     . lisinopril-hydrochlorothiazide (PRINZIDE,ZESTORETIC) 20-12.5 MG per tablet                3     . nystatin (NYSTOP) 100000 UNIT/GM Powder                1     . rOPINIRole (REQUIP) 0.5 MG tablet    Oral    Take 1 mg by mouth nightly.                Marland Kitchen UNKNOWN TO PATIENT                        Physical Exam     ED Triage Vitals   Enc Vitals Group      BP 11/29/14 1149 168/75 mmHg      Heart Rate 11/29/14 1149 81      Resp Rate 11/29/14 1149 18      Temp 11/29/14 1149 97.9 F (36.6 C)      Temp src --       SpO2 11/29/14 1149 98 %      Weight 11/29/14 1149 55.792 kg      Height 11/29/14 1149 1.575 m      Head Cir --       Peak Flow --       Pain Score 11/29/14 1222 1                  Constitutional: . Well appearing, no acute distress.  Head: Normocephalic, atraumatic  Eyes: Conjunctiva and sclera are normal.  No injection or discharge.  Ears, Nose, Throat:  Normal external examination of the nose and ears.    Neck: Normal range of motion. Trachea midline.  Respiratory/Chest: No respiratory distress.  Clear breath sounds.  Cardiac:  Regular Rate and Rhythm   Neurological: No focal motor deficits by observation. Speech normal.  Skin: Warm and dry. Erythema is much less intense.  Psychiatric: Alert and conversant.  Normal affect.  Normal insight.  ED Medications Administered     Medications   vancomycin (VANCOCIN) 1 g in sodium chloride 0.9 % 250 mL  IVPB (vial-mate) (1 g Intravenous New Bag 11/29/14 1336)     Orders Placed During this Encounter     No orders of the defined types were placed in this encounter.     Data Review     Nursing records reviewed and agree: Yes    Diagnostic Study Results     Course Notes, Procedures and MDM   1539: Feeling better. IV antibiotics are complete. She will return tomorrow for a recheck.     Diagnosis and Disposition     Clinical Impression  1. Cellulitis of face        Disposition  ED Disposition     Discharge Carla Cannon discharge to home/self care.    Condition at disposition: Stable            Follow Up  [facility] Emergency Department  [address] [facility] [place] [address]  [phone]  In 1 day  RE CHECK      Prescriptions    Discharge Medication List as of 11/29/2014  3:43 PM                 THIS CHART WAS DOCUMENTED BY ASHLEY  WILLIAMS, SCRIBE.             Nicolasa Ducking, MD  11/29/14 2308

## 2014-11-29 NOTE — ED Notes (Signed)
wf here with 2nd day of iv therapy for facial cellutlitis.  Pt and neighbor state looks better.

## 2014-11-29 NOTE — ED Notes (Signed)
Iv intact to left ac upon arrival

## 2014-11-29 NOTE — ED Notes (Signed)
Refuse liquids or snacks when offered.

## 2014-11-29 NOTE — ED Notes (Signed)
Iv flushed and patent ; wrapped at [place].  To return tomorrow.

## 2014-11-30 ENCOUNTER — Emergency Department: Payer: Medicare Other

## 2014-11-30 ENCOUNTER — Emergency Department
Admission: EM | Admit: 2014-11-30 | Discharge: 2014-11-30 | Disposition: A | Discharge status: Home / Self Care | Payer: Medicare Other | Attending: Emergency Medicine | Admitting: Emergency Medicine

## 2014-11-30 DIAGNOSIS — L03211 Cellulitis of face: Secondary | ICD-10-CM | POA: Insufficient documentation

## 2014-11-30 DIAGNOSIS — I10 Essential (primary) hypertension: Secondary | ICD-10-CM | POA: Insufficient documentation

## 2014-11-30 MED ORDER — VANCOMYCIN HCL 1000 MG IV SOLR
1.0000 g | Freq: Once | INTRAVENOUS | Status: AC
Start: 2014-11-30 — End: 2014-11-30
  Administered 2014-11-30: 1 g via INTRAVENOUS

## 2014-11-30 MED ORDER — SULFAMETHOXAZOLE-TRIMETHOPRIM 800-160 MG PO TABS
1.0000 | ORAL_TABLET | Freq: Two times a day (BID) | ORAL | Status: AC
Start: 2014-11-30 — End: 2014-12-10

## 2014-11-30 MED ORDER — VANCOMYCIN HCL 1000 MG IV SOLR
INTRAVENOUS | Status: AC
Start: 2014-11-30 — End: ?
  Filled 2014-11-30: qty 1000

## 2014-11-30 NOTE — ED Notes (Addendum)
Patient returns to ED for wound recheck to left nose and possible repeat IV antibiotics.

## 2014-11-30 NOTE — Discharge Instructions (Signed)
Discharge Instructions for Cellulitis  You have been diagnosed with cellulitis. This is an infection in the deepest layer of the skin. In some cases, the infection also affects the muscle. Cellulitis is caused by bacteria. The bacteria canenter the body through broken skin. This can happen with a cut, scratch, animal bite, or an insect bite that has been scratched. You may have been treated in the hospital with antibiotics and fluids. You will likely be given a prescription for antibiotics to take at home. This sheet will help you take care of yourself at home.  Home Care  When you are home:   Take the prescribed antibiotic medication you are given as directed until it is gone. Take it even if you feel better. It treats the infection and stops it from returning. Not taking all of the medication can make future infections hard to treat.   Keep the infected area clean.   When possible, raise the infected area above the level of your heart. This helps keep swelling down.   Talk to your doctor if you are in pain. Ask what kind of over-the-counter medication you can take for pain.   Apply clean bandages as advised.   Take your temperature once a day for a week.   Wash your hands often to prevent spreading the infection.  In the future, wash your hands before and after you touch cuts, scratches, or bandages. This will help prevent infection.     2000-2015 [employer] [address], [place] [address]. All rights reserved. This information is not intended as a substitute for professional medical care. Always follow your healthcare professional's instructions.

## 2014-11-30 NOTE — ED Notes (Signed)
MD at bedside. 

## 2014-11-30 NOTE — ED Provider Notes (Signed)
Physician/Midlevel provider first contact with patient: 11/30/14 1212         History     Chief Complaint   Patient presents with   . Wound Check   patient is here for recheck of cellulitis of the left side of the bridge of the nose. This is her third visit as planned. A culture is growing MRSA. I will give her a dose of vancomycin as he is received the last 2 days, I will change her Keflex she is taking at home to Bactrim. She feels as if the condition is improving, is minimally tender. No fevers.          Past Medical History   Diagnosis Date   . Arthritis    . Hypertension    . Osteopenia    . Arthritis    . Osteoarthritis    . Pulmonary HTN    . Prediabetes    . RLS (restless legs syndrome)    . Anemia        Past Surgical History   Procedure Laterality Date   . Hysterectomy     . Elbow surgery     . Cataract extraction     . Wrist fracture surgery Left 10.4.2006       Family History   Problem Relation Age of Onset   . Arthritis Mother        Social  History   Substance Use Topics   . Smoking status: Never Smoker    . Smokeless tobacco: Not on file   . Alcohol Use: No       .     Allergies   Allergen Reactions   . Cephalosporins Photosensitivity   . Ciprocin-Fluocin-Procin [Fluocinolone] Photosensitivity   . Codeine Nausea Only   . Penicillins Nausea Only   . Ketoconazole Rash       Discharge Medication List as of 11/30/2014  1:48 PM      CONTINUE these medications which have NOT CHANGED    Details   aspirin EC 81 MG EC tablet Take 81 mg by mouth daily., Until Discontinued, Historical Med      cephALEXin (KEFLEX) 250 MG capsule Take 1 capsule (250 mg total) by mouth 4 (four) times daily., Starting 11/28/2014, Until Discontinued, Print      gabapentin (NEURONTIN) 100 MG capsule Take 100 mg by mouth daily.   , Starting 02/28/2014, Until Discontinued, Historical Med      lisinopril-hydrochlorothiazide (PRINZIDE,ZESTORETIC) 20-12.5 MG per tablet Take 1 tablet by mouth daily.   , Starting 12/18/2013, Until  Discontinued, Historical Med      nystatin (NYSTOP) 100000 UNIT/GM Powder 1 application daily.   , Starting 03/13/2014, Until Discontinued, Historical Med      rOPINIRole (REQUIP) 0.5 MG tablet Take 1 mg by mouth nightly.   , Starting 03/02/2014, Until Discontinued, Historical Med      UNKNOWN TO PATIENT Until Discontinued, Historical Med              Review of Systems   Constitutional: Negative for fever and chills.   Skin: Positive for color change and wound.        Left nose       Physical Exam    BP: 151/85 mmHg, Heart Rate: 88, Temp: 98.1 F (36.7 C), Resp Rate: 16, SpO2: 95 %     Physical Exam   Constitutional: She is oriented to person, place, and time. She appears well-developed and well-nourished.   HENT:   Head: Normocephalic and  atraumatic.   Area of redness and previous I&D on the left side of the bridge of the nose. The area of redness extending on to the left cheek is lessened today from yesterday.   Eyes: EOM are normal. Pupils are equal, round, and reactive to light.   Cardiovascular: Normal rate and regular rhythm.    Pulmonary/Chest: Effort normal and breath sounds normal.   Neurological: She is alert and oriented to person, place, and time.   Skin: Skin is warm and dry.   Psychiatric: She has a normal mood and affect. Her behavior is normal.   Nursing note and vitals reviewed.        MDM and ED Course   I reviewed the culture results,which are MRSA.  I will Delta the keflex, and write for bactrim.  She has an appt on Tuesday at the office.   ED Medication Orders     Start     Status Ordering Provider    11/30/14 1214  vancomycin (VANCOCIN) 1 g in sodium chloride 0.9 % 250 mL IVPB (vial-mate)   Once in ED     Route: Intravenous  Ordered Dose: 1 g     Last MAR action:  New Bag Yisrael Obryan, PRESTON              MDM       Procedures    Clinical Impression & Disposition     Clinical Impression  Final diagnoses:   Cellulitis of face        ED Disposition     Discharge Geryl Dohn discharge to home/self  care.    Condition at disposition: Stable             Discharge Medication List as of 11/30/2014  1:48 PM      START taking these medications    Details   sulfamethoxazole-trimethoprim (BACTRIM DS,SEPTRA DS) 800-160 MG per tablet Take 1 tablet by mouth 2 (two) times daily., Starting 11/30/2014, Until Mon 12/10/14, Print                       Yevette Edwards, MD  11/30/14 [phone]

## 2015-09-09 ENCOUNTER — Emergency Department: Payer: Medicare Other

## 2015-09-09 ENCOUNTER — Emergency Department
Admission: EM | Admit: 2015-09-09 | Discharge: 2015-09-09 | Disposition: A | Discharge status: Home / Self Care | Payer: Medicare Other | Attending: General Practice | Admitting: General Practice

## 2015-09-09 DIAGNOSIS — L03211 Cellulitis of face: Secondary | ICD-10-CM | POA: Insufficient documentation

## 2015-09-09 MED ORDER — SULFAMETHOXAZOLE-TRIMETHOPRIM 800-160 MG PO TABS
1.0000 | ORAL_TABLET | Freq: Two times a day (BID) | ORAL | Status: AC
Start: 2015-09-09 — End: 2015-09-19

## 2015-09-09 MED ORDER — CEPHALEXIN 500 MG PO CAPS
500.0000 mg | ORAL_CAPSULE | Freq: Three times a day (TID) | ORAL | Status: AC
Start: 2015-09-09 — End: 2015-09-16

## 2015-09-09 NOTE — ED Notes (Signed)
C/o irritation both eyes x 1 week .Vision clear.

## 2015-09-09 NOTE — Discharge Instructions (Signed)
Facial Cellulitis  You have an infection of the skin known as cellulitis. This usually starts with a scrape, cut or insect bite which becomes infected. It may also occur from an infected oil gland (pimple) or hair follicle. This can be a serious condition and must be watched closely to be sure the infection is not spreading.  With antibiotic treatment, the size of the red area will gradually shrink in size until the skin returns to normal. This will take 7-10 days.  The red area should never increase in size once the antibiotic medicine has been started. Occasionally, an infection will be resistant to one antibiotic and another one will have to be used.  Home Care:  1) Take all of the antibiotic medicine exactly as prescribed until it is gone. Be careful not to miss any doses, especially during the first few days.  2) A cool compress (face cloth soaked in cool water) applied to the face may help with the swelling and pain.  3) You may use acetaminophen (Tylenol) or ibuprofen (Motrin, Advil) to control pain, unless another medicine was prescribed. [ NOTE : If you have chronic liver or kidney disease or ever had a stomach ulcer or GI bleeding, talk with your doctor before using these medicines.] (Aspirin should never be used in anyone under 18 years of age who is ill with a fever. It may cause severe liver damage.)  Follow Up  with your doctor or this facility as directed. Check the infected area daily for the warning signs listed below.  Get Prompt Medical Attention  if any of the following occur:  -- Increasing area of redness, swelling or pain  -- Pus or fluid drainage from the skin or the eye  -- Fever of 100.5 F (38 C) oral or 101.5 F (38.6 C) rectal for more than two days on antibiotics  -- Eyelid swells shut  -- Increasing headache or neck pain  -- Unusual drowsiness or confusion  -- Convulsion (seizure)   2000-2015 [employer] [address], [place] [address]. All rights reserved.  This information is not intended as a substitute for professional medical care. Always follow your healthcare professional's instructions.

## 2015-09-13 NOTE — ED Provider Notes (Signed)
Physician/Midlevel provider first contact with patient: 09/09/15 1045         History     Chief Complaint   Patient presents with   . Eye Problem     HPI   81 year old female came to emergency room complaining of facial redness patient has had this problem for a week is itching and burning. Also patient is complaining of irritation "eyes. No fever.      Past Medical History   Diagnosis Date   . Arthritis    . Hypertension    . Osteopenia    . Arthritis    . Osteoarthritis    . Pulmonary HTN    . Prediabetes    . RLS (restless legs syndrome)    . Anemia        Past Surgical History   Procedure Laterality Date   . Hysterectomy     . Elbow surgery     . Cataract extraction     . Wrist fracture surgery Left 10.4.2006       Family History   Problem Relation Age of Onset   . Arthritis Mother        Social  Social History   Substance Use Topics   . Smoking status: Never Smoker    . Smokeless tobacco: None   . Alcohol Use: No       .     Allergies   Allergen Reactions   . Cephalosporins Photosensitivity   . Ciprocin-Fluocin-Procin [Fluocinolone] Photosensitivity   . Codeine Nausea Only   . Penicillins Nausea Only   . Ketoconazole Rash       Home Medications                   aspirin EC 81 MG EC tablet     Take 81 mg by mouth daily.     gabapentin (NEURONTIN) 100 MG capsule     Take 100 mg by mouth daily.        lisinopril-hydrochlorothiazide (PRINZIDE,ZESTORETIC) 20-12.5 MG per tablet     Take 1 tablet by mouth daily.        nystatin (NYSTOP) 100000 UNIT/GM Powder     1 application daily.        rOPINIRole (REQUIP) 0.5 MG tablet     Take 1 mg by mouth nightly.        UNKNOWN TO PATIENT                          Review of Systems   All other systems reviewed and are negative.      Physical Exam    BP: 170/76 mmHg, Heart Rate: 81, Temp: 97.8 F (36.6 C), Resp Rate: 14, SpO2: 100 %, Weight: 60.782 kg    Physical Exam   Constitutional: She is oriented to person, place, and time. She appears well-developed and well-nourished.  No distress.   HENT:   Head: Normocephalic and atraumatic.   Right Ear: External ear normal.   Left Ear: External ear normal.   Nose: Nose normal.   Mouth/Throat: Oropharynx is clear and moist. No oropharyngeal exudate.   Neck: Normal range of motion. Neck supple. No JVD present. No tracheal deviation present. No thyromegaly present.   Cardiovascular: Normal rate, regular rhythm and normal heart sounds.    Pulmonary/Chest: Effort normal and breath sounds normal. No stridor. No respiratory distress. She has no wheezes. She has no rales. She exhibits no tenderness.   Abdominal: Soft.  Bowel sounds are normal. She exhibits no distension and no mass. There is no tenderness. There is no rebound and no guarding. No hernia.   Musculoskeletal: Normal range of motion. She exhibits no edema or tenderness.   Lymphadenopathy:     She has no cervical adenopathy.   Neurological: She is alert and oriented to person, place, and time.   Skin: Skin is warm. No rash noted. She is not diaphoretic. There is erythema. No pallor.   Patchy erythema of face nose and aroundeyes   Nursing note and vitals reviewed.        MDM and ED Course     ED Medication Orders     None             MDM  Patient was started on Bactrim DS to be taken twice a day. See family physician for follow-up. Return to emergency room if needed.        Procedures    Clinical Impression & Disposition     Clinical Impression  Final diagnoses:   Cellulitis, face        ED Disposition     Discharge Darrick Meigs discharge to home/self care.    Condition at disposition: Stable             Discharge Medication List as of 09/09/2015 11:48 AM      START taking these medications    Details   sulfamethoxazole-trimethoprim (BACTRIM DS,SEPTRA DS) 800-160 MG per tablet Take 1 tablet by mouth 2 (two) times daily., Starting 09/09/2015, Until Thu 09/19/15, Print                         Avarey Yaeger, Jean Rosenthal, MD  09/13/15 [phone]

## 2017-02-08 ENCOUNTER — Other Ambulatory Visit: Payer: Self-pay | Admitting: Family Medicine

## 2017-02-08 ENCOUNTER — Encounter: Payer: Self-pay | Admitting: Family Medicine

## 2017-02-08 DIAGNOSIS — Z780 Asymptomatic menopausal state: Secondary | ICD-10-CM

## 2017-02-08 DIAGNOSIS — M8580 Other specified disorders of bone density and structure, unspecified site: Secondary | ICD-10-CM

## 2017-02-11 ENCOUNTER — Ambulatory Visit
Admission: RE | Admit: 2017-02-11 | Discharge: 2017-02-11 | Disposition: A | Discharge status: Home / Self Care | Payer: Medicare Other | Source: Ambulatory Visit | Attending: Family Medicine | Admitting: Family Medicine

## 2017-02-11 DIAGNOSIS — M810 Age-related osteoporosis without current pathological fracture: Secondary | ICD-10-CM | POA: Insufficient documentation

## 2017-02-11 DIAGNOSIS — Z780 Asymptomatic menopausal state: Secondary | ICD-10-CM

## 2017-02-11 DIAGNOSIS — M85852 Other specified disorders of bone density and structure, left thigh: Secondary | ICD-10-CM

## 2017-05-07 ENCOUNTER — Encounter: Payer: Self-pay | Admitting: Family Medicine

## 2017-05-07 DIAGNOSIS — R600 Localized edema: Secondary | ICD-10-CM

## 2017-05-28 ENCOUNTER — Ambulatory Visit
Admission: RE | Admit: 2017-05-28 | Discharge: 2017-05-28 | Disposition: A | Discharge status: Home / Self Care | Payer: Medicare Other | Source: Ambulatory Visit | Attending: Family Medicine | Admitting: Family Medicine

## 2017-05-28 DIAGNOSIS — I351 Nonrheumatic aortic (valve) insufficiency: Secondary | ICD-10-CM

## 2017-05-28 DIAGNOSIS — R600 Localized edema: Secondary | ICD-10-CM | POA: Insufficient documentation

## 2017-05-28 DIAGNOSIS — I10 Essential (primary) hypertension: Secondary | ICD-10-CM | POA: Insufficient documentation

## 2017-05-28 DIAGNOSIS — I340 Nonrheumatic mitral (valve) insufficiency: Secondary | ICD-10-CM | POA: Insufficient documentation

## 2017-12-30 ENCOUNTER — Ambulatory Visit (RURAL_HEALTH_CENTER): Payer: Self-pay

## 2017-12-30 LAB — CBC AND DIFFERENTIAL FRFP
Granulocytes %: 66.2 % (ref 43–76)
Granulocytes Absolute: 4.4 10 (ref 1.2–6.8)
Hematocrit: 37.5 % (ref 35–50)
Hemoglobin: 12.3 g/dl (ref 11–16.5)
Lymphocytes %: 26.5 % (ref 17–48)
Lymphocytes Absolute: 1.6 10 (ref 1.2–3.2)
MCH: 27.9 pg (ref 26.5–33.5)
MCHC: 32.7 g/dl (ref 31.5–35)
MCV: 85 µm (ref 80–97)
Mean Platelet Volume: 8.2 µm (ref 6.5–11)
Monocytes %: 7.3 % (ref 4–10)
Monocytes Absolute: 0.4 10 (ref 0.3–0.8)
Platelet Count: 293 10 (ref 150–450)
RBC: 4.4 10 (ref 3.8–5.8)
RDW: 16.4 % — ABNORMAL HIGH (ref 10–15)
WBC: 6.4 10 (ref 3.5–10)

## 2017-12-31 ENCOUNTER — Ambulatory Visit (RURAL_HEALTH_CENTER): Payer: Self-pay

## 2017-12-31 LAB — COMPREHENSIVE METABOLIC PANEL
ALT: 10 IU/L (ref 0–32)
AST (SGOT): 22 IU/L (ref 0–40)
African American eGFR: 74 mL/min/{1.73_m2} (ref >59)
Albumin/Globulin Ratio: 1.4 (ref 1.2–2.2)
Albumin: 3.6 g/dL (ref 3.2–4.6)
Alkaline Phosphatase: 120 IU/L — ABNORMAL HIGH (ref 39–117)
BUN / Creatinine Ratio: 15 (ref 12–28)
BUN: 12 mg/dL (ref 10–36)
Bilirubin, Total: 0.9 mg/dL (ref 0.0–1.2)
CO2: 25 mmol/L (ref 20–29)
Calcium: 9.2 mg/dL (ref 8.7–10.3)
Chloride: 107 mmol/L — ABNORMAL HIGH (ref 96–106)
Creatinine: 0.79 mg/dL (ref 0.57–1.00)
Globulin, Total: 2.6 g/dL (ref 1.5–4.5)
Glucose: 102 mg/dL — ABNORMAL HIGH (ref 65–99)
Potassium: 4.3 mmol/L (ref 3.5–5.2)
Protein, Total: 6.2 g/dL (ref 6.0–8.5)
Sodium: 146 mmol/L — ABNORMAL HIGH (ref 134–144)
non-African American eGFR: 64 mL/min/{1.73_m2} (ref >59)

## 2017-12-31 LAB — IRON PROFILE
Iron Saturation: 12 % — ABNORMAL LOW (ref 15–55)
Iron: 47 ug/dL (ref 27–139)
TIBC: 377 ug/dL (ref 250–450)
UIBC: 330 ug/dL (ref 118–369)

## 2017-12-31 LAB — HEMOGLOBIN A1C: Hemoglobin A1C: 5.9 % — ABNORMAL HIGH (ref 4.8–5.6)

## 2017-12-31 LAB — LIPID PANEL, WITHOUT TOTAL CHOLESTEROL/HDL RATIO, SERUM
Cholesterol: 143 mg/dL (ref 100–199)
HDL: 53 mg/dL (ref >39)
LDL Chol Calculated (NIH): 77 mg/dL (ref 0–99)
Triglycerides: 64 mg/dL (ref 0–149)
VLDL Calculated: 13 mg/dL (ref 5–40)

## 2017-12-31 LAB — FERRITIN: Ferritin: 12 ng/mL — ABNORMAL LOW (ref 15–150)

## 2018-06-03 ENCOUNTER — Ambulatory Visit (RURAL_HEALTH_CENTER): Payer: Self-pay

## 2018-06-03 LAB — COMPREHENSIVE METABOLIC PANEL
ALT: 10 IU/L (ref 0–32)
AST (SGOT): 18 IU/L (ref 0–40)
African American eGFR: 74 mL/min/{1.73_m2} (ref >59)
Albumin/Globulin Ratio: 1.6 (ref 1.2–2.2)
Albumin: 3.5 g/dL (ref 3.2–4.6)
Alkaline Phosphatase: 102 IU/L (ref 39–117)
BUN / Creatinine Ratio: 15 (ref 12–28)
BUN: 12 mg/dL (ref 10–36)
Bilirubin, Total: 0.8 mg/dL (ref 0.0–1.2)
CO2: 24 mmol/L (ref 20–29)
Calcium: 9 mg/dL (ref 8.7–10.3)
Chloride: 105 mmol/L (ref 96–106)
Creatinine: 0.79 mg/dL (ref 0.57–1.00)
Globulin, Total: 2.2 g/dL (ref 1.5–4.5)
Glucose: 184 mg/dL — ABNORMAL HIGH (ref 65–99)
Potassium: 4 mmol/L (ref 3.5–5.2)
Protein, Total: 5.7 g/dL — ABNORMAL LOW (ref 6.0–8.5)
Sodium: 145 mmol/L — ABNORMAL HIGH (ref 134–144)
non-African American eGFR: 64 mL/min/{1.73_m2} (ref >59)

## 2018-06-16 ENCOUNTER — Ambulatory Visit (RURAL_HEALTH_CENTER): Payer: Self-pay

## 2018-06-16 LAB — CBC AND DIFFERENTIAL FRFP
Granulocytes %: 71.4 % (ref 43–76)
Granulocytes Absolute: 5.7 10 (ref 1.2–6.8)
Hematocrit: 41.1 % (ref 35–50)
Hemoglobin: 13.3 g/dl (ref 11–16.5)
Lymphocytes %: 23.8 % (ref 17–48)
Lymphocytes Absolute: 1.8 10 (ref 1.2–3.2)
MCH: 28.5 pg (ref 26.5–33.5)
MCHC: 32.4 g/dl (ref 31.5–35)
MCV: 88 µm (ref 80–97)
Mean Platelet Volume: 8 µm (ref 6.5–11)
Monocytes %: 4.8 % (ref 4–10)
Monocytes Absolute: 0.3 10 (ref 0.3–0.8)
Platelet Count: 281 10 (ref 150–450)
RBC: 4.66 10 (ref 3.8–5.8)
RDW: 16.4 % — ABNORMAL HIGH (ref 10–15)
WBC: 7.8 10 (ref 3.5–10)

## 2018-06-17 ENCOUNTER — Ambulatory Visit (RURAL_HEALTH_CENTER): Payer: Self-pay

## 2018-06-17 LAB — COMPREHENSIVE METABOLIC PANEL
ALT: 11 IU/L (ref 0–32)
AST (SGOT): 22 IU/L (ref 0–40)
African American eGFR: 68 mL/min/{1.73_m2} (ref >59)
Albumin/Globulin Ratio: 1.6 (ref 1.2–2.2)
Albumin: 3.9 g/dL (ref 3.2–4.6)
Alkaline Phosphatase: 103 IU/L (ref 39–117)
BUN / Creatinine Ratio: 21 (ref 12–28)
BUN: 18 mg/dL (ref 10–36)
Bilirubin, Total: 0.8 mg/dL (ref 0.0–1.2)
CO2: 24 mmol/L (ref 20–29)
Calcium: 9.2 mg/dL (ref 8.7–10.3)
Chloride: 104 mmol/L (ref 96–106)
Creatinine: 0.84 mg/dL (ref 0.57–1.00)
Globulin, Total: 2.5 g/dL (ref 1.5–4.5)
Glucose: 94 mg/dL (ref 65–99)
Potassium: 4.5 mmol/L (ref 3.5–5.2)
Protein, Total: 6.4 g/dL (ref 6.0–8.5)
Sodium: 145 mmol/L — ABNORMAL HIGH (ref 134–144)
non-African American eGFR: 59 mL/min/{1.73_m2} — ABNORMAL LOW (ref >59)

## 2018-06-17 LAB — LIPID PANEL, WITHOUT TOTAL CHOLESTEROL/HDL RATIO, SERUM
Cholesterol: 172 mg/dL (ref 100–199)
HDL: 53 mg/dL (ref >39)
LDL Chol Calculated (NIH): 93 mg/dL (ref 0–99)
Triglycerides: 130 mg/dL (ref 0–149)
VLDL Calculated: 26 mg/dL (ref 5–40)

## 2018-06-17 LAB — HEMOGLOBIN A1C: Hemoglobin A1C: 6.4 % — ABNORMAL HIGH (ref 4.8–5.6)

## 2018-10-27 ENCOUNTER — Ambulatory Visit (RURAL_HEALTH_CENTER): Payer: Self-pay

## 2018-10-27 LAB — CBC AND DIFFERENTIAL FRFP
Granulocytes %: 71.7 % (ref 43–76)
Granulocytes Absolute: 5.4 10 (ref 1.2–6.8)
Hematocrit: 41.6 % (ref 35–50)
Hemoglobin: 13.3 g/dl (ref 11–16.5)
Lymphocytes %: 22.3 % (ref 17–48)
Lymphocytes Absolute: 1.6 10 (ref 1.2–3.2)
MCH: 27.7 pg (ref 26.5–33.5)
MCHC: 32 g/dl (ref 31.5–35)
MCV: 86 µm (ref 80–97)
Mean Platelet Volume: 8.1 µm (ref 6.5–11)
Monocytes %: 6 % (ref 4–10)
Monocytes Absolute: 0.4 10 (ref 0.3–0.8)
Platelet Count: 239 10 (ref 150–450)
RBC: 4.81 10 (ref 3.8–5.8)
RDW: 15.6 % — ABNORMAL HIGH (ref 10–15)
WBC: 7.4 10 (ref 3.5–10)

## 2018-10-28 ENCOUNTER — Ambulatory Visit (RURAL_HEALTH_CENTER): Payer: Self-pay

## 2018-10-28 LAB — COMPREHENSIVE METABOLIC PANEL
ALT: 11 IU/L (ref 0–32)
AST (SGOT): 19 IU/L (ref 0–40)
African American eGFR: 71 mL/min/{1.73_m2} (ref >59)
Albumin/Globulin Ratio: 1.4 (ref 1.2–2.2)
Albumin: 3.6 g/dL (ref 3.2–4.6)
Alkaline Phosphatase: 113 IU/L (ref 39–117)
BUN / Creatinine Ratio: 19 (ref 12–28)
BUN: 15 mg/dL (ref 10–36)
Bilirubin, Total: 1.1 mg/dL (ref 0.0–1.2)
CO2: 21 mmol/L (ref 20–29)
Calcium: 9.1 mg/dL (ref 8.7–10.3)
Chloride: 108 mmol/L — ABNORMAL HIGH (ref 96–106)
Creatinine: 0.81 mg/dL (ref 0.57–1.00)
Globulin, Total: 2.5 g/dL (ref 1.5–4.5)
Glucose: 104 mg/dL — ABNORMAL HIGH (ref 65–99)
Potassium: 4.3 mmol/L (ref 3.5–5.2)
Protein, Total: 6.1 g/dL (ref 6.0–8.5)
Sodium: 145 mmol/L — ABNORMAL HIGH (ref 134–144)
non-African American eGFR: 61 mL/min/{1.73_m2} (ref >59)

## 2018-10-28 LAB — HEMOGLOBIN A1C: Hemoglobin A1C: 6.1 % — ABNORMAL HIGH (ref 4.8–5.6)

## 2019-10-18 ENCOUNTER — Emergency Department: Payer: Medicare Other

## 2019-10-18 ENCOUNTER — Emergency Department
Admission: EM | Admit: 2019-10-18 | Discharge: 2019-10-18 | Disposition: A | Discharge status: Home / Self Care | Payer: Medicare Other | Attending: Emergency Medicine | Admitting: Emergency Medicine

## 2019-10-18 ENCOUNTER — Ambulatory Visit (RURAL_HEALTH_CENTER): Payer: Self-pay

## 2019-10-18 DIAGNOSIS — S0990XA Unspecified injury of head, initial encounter: Secondary | ICD-10-CM | POA: Insufficient documentation

## 2019-10-18 DIAGNOSIS — S60511A Abrasion of right hand, initial encounter: Secondary | ICD-10-CM

## 2019-10-18 DIAGNOSIS — S60415A Abrasion of left ring finger, initial encounter: Secondary | ICD-10-CM

## 2019-10-18 DIAGNOSIS — S60413A Abrasion of left middle finger, initial encounter: Secondary | ICD-10-CM

## 2019-10-18 DIAGNOSIS — W19XXXA Unspecified fall, initial encounter: Secondary | ICD-10-CM

## 2019-10-18 DIAGNOSIS — W0110XA Fall on same level from slipping, tripping and stumbling with subsequent striking against unspecified object, initial encounter: Secondary | ICD-10-CM

## 2019-10-18 DIAGNOSIS — S0083XA Contusion of other part of head, initial encounter: Secondary | ICD-10-CM

## 2019-10-18 DIAGNOSIS — S60419A Abrasion of unspecified finger, initial encounter: Secondary | ICD-10-CM

## 2019-10-18 DIAGNOSIS — S51019A Laceration without foreign body of unspecified elbow, initial encounter: Secondary | ICD-10-CM

## 2019-10-18 DIAGNOSIS — S51011A Laceration without foreign body of right elbow, initial encounter: Secondary | ICD-10-CM

## 2019-10-18 DIAGNOSIS — Y92480 Sidewalk as the place of occurrence of the external cause: Secondary | ICD-10-CM

## 2019-10-18 DIAGNOSIS — S60417A Abrasion of left little finger, initial encounter: Secondary | ICD-10-CM

## 2019-10-18 LAB — COMPREHENSIVE METABOLIC PANEL
ALT: 8 IU/L (ref 0–32)
AST (SGOT): 16 IU/L (ref 0–40)
African American eGFR: 74 mL/min/{1.73_m2} (ref >59)
Albumin/Globulin Ratio: 1.5 (ref 1.2–2.2)
Albumin: 3.7 g/dL (ref 3.5–4.6)
Alkaline Phosphatase: 123 IU/L — ABNORMAL HIGH (ref 39–117)
BUN / Creatinine Ratio: 10 — ABNORMAL LOW (ref 12–28)
BUN: 8 mg/dL — ABNORMAL LOW (ref 10–36)
Bilirubin, Total: 1.6 mg/dL — ABNORMAL HIGH (ref 0.0–1.2)
CO2: 24 mmol/L (ref 20–29)
Calcium: 8.8 mg/dL (ref 8.7–10.3)
Chloride: 104 mmol/L (ref 96–106)
Creatinine: 0.78 mg/dL (ref 0.57–1.00)
Globulin, Total: 2.4 g/dL (ref 1.5–4.5)
Glucose: 112 mg/dL — ABNORMAL HIGH (ref 65–99)
Potassium: 4 mmol/L (ref 3.5–5.2)
Protein, Total: 6.1 g/dL (ref 6.0–8.5)
Sodium: 138 mmol/L (ref 134–144)
non-African American eGFR: 64 mL/min/{1.73_m2} (ref >59)

## 2019-10-18 LAB — CBC AND DIFFERENTIAL FRFP
Baso(Absolute): 0.1 10*3/uL (ref 0.0–0.2)
Basos: 1 %
Eos: 1 %
Eosinophils Absolute: 0.1 10*3/uL (ref 0.0–0.4)
Hematocrit: 33.1 % — ABNORMAL LOW (ref 34.0–46.6)
Hemoglobin: 10.5 g/dL — ABNORMAL LOW (ref 11.1–15.9)
Immature Granulocytes Absolute: 0 10*3/uL (ref 0.0–0.1)
Immature Granulocytes: 0 %
Lymphocytes Absolute: 1.4 10*3/uL (ref 0.7–3.1)
Lymphocytes: 20 %
MCH: 24.8 pg — ABNORMAL LOW (ref 26.6–33.0)
MCHC: 31.7 g/dL (ref 31.5–35.7)
MCV: 78 fL — ABNORMAL LOW (ref 79–97)
Monocytes Absolute: 0.6 10*3/uL (ref 0.1–0.9)
Monocytes: 9 %
Neutrophils Absolute: 5 10*3/uL (ref 1.4–7.0)
Neutrophils: 69 %
Platelets: 314 10*3/uL (ref 150–450)
RBC: 4.23 x10E6/uL (ref 3.77–5.28)
RDW: 15.7 % — ABNORMAL HIGH (ref 11.7–15.4)
WBC: 7.3 10*3/uL (ref 3.4–10.8)

## 2019-10-18 LAB — LIPID PANEL, WITHOUT TOTAL CHOLESTEROL/HDL RATIO, SERUM
Cholesterol: 138 mg/dL (ref 100–199)
HDL: 46 mg/dL (ref >39)
LDL Chol Calculated (NIH): 74 mg/dL (ref 0–99)
Triglycerides: 97 mg/dL (ref 0–149)
VLDL Calculated: 18 mg/dL (ref 5–40)

## 2019-10-18 LAB — HEMOGLOBIN A1C: Hemoglobin A1C: 6.1 % — ABNORMAL HIGH (ref 4.8–5.6)

## 2019-10-18 MED ORDER — LIDOCAINE 4 % EX CREA
TOPICAL_CREAM | CUTANEOUS | Status: AC
Start: 2019-10-18 — End: ?
  Filled 2019-10-18: qty 5

## 2019-10-18 NOTE — ED Notes (Signed)
Bed: ED9-A  Expected date:   Expected time:   Means of arrival:   Comments:  EMS

## 2019-10-18 NOTE — Discharge Instructions (Signed)
YOUR FALL CAUSED CONTUSION/ABRASION TO RIGHT UPPER FOREHEAD.  THE CT SCAN OF YOUR HEAD SHOWS NO INTRACRANIAL INJURY.   NO SIGNS OF CONCUSSION.     YOU HAVE MULTIPLE ABRASIONS/SKIN TEARS TO RIGHT ELBOW AND BOTH HANDS.   MAY WASH , REAPPLY TOPICAL ANTIBIOTIC OINTMENT AND DRY DRESSING.   RETURN TO ER IF ANY CONCERN FOR CONCUSSION OR WOUND INFECTION.       Facial Bruise (Contusion)  A bruise (contusion) happens when small blood vessels break open and leak blood into the nearby area. This can happen from a bump, hit, or fall. This may happen during sports, an accident, or during a fight. Symptoms often includechanges in skin color (bruising),swelling, and pain.   The swelling from the bruise shoulddecreasein a few days. Bruising and pain may take several weeks to go away.   Home care   If you have been prescribed medicines for pain, take them as directed.   To help reduce swelling and pain, wrap a cold pack or bag of frozen peas in a thin towel. Put it on the injured area for up to 20 minutes. Do this a few times a day until the swelling goes down.   If you have scrapes or cuts on your facerequiring stitches or other closures, care for them as directed.   For the next 24 hours (or longer if instructed):  ? Dont drink alcohol, or use sedatives or medicines that make you sleepy.  ? Dont drive or operate machinery.  ? Don't do anything strenuous. Dont lift or strain.  ? Don't return to sports or other activity that could result in another head injury.    Note about concussions  Because the injury was to your head, it's possible that a concussion (mild brain injury) could result.Symptomsof a concussioncan show up later. Be alert for signs and symptoms of a concussion. Seek emergency medical care if any of these develop over the next hours to days:    Headache   Nausea or vomiting   Dizziness   Sensitivity to light or noise   Unusual sleepiness or grogginess   Trouble falling asleep   Personality  changes   Vision changes   Memory loss   Confusion   Trouble walking or clumsiness   Loss of consciousness (even for a short time)   Inability to be awakened   Feeling "off" or slow as if in a daze  Follow-up care  Follow up with your healthcare provider, or as directed.  When to seek medical advice  Call your healthcare provider right away if any of these occur:   Swelling or pain that gets worse, not better   New swelling or pain   Warmth or drainage from the swollen areaor from cuts or scrapes   Fluid drainage or bleeding from the nose or ears   Fever of 100.27F (38C) or higher, or as directed by your healthcare provider  Call 911  Call 911if any of the following occur:    Repeated vomiting   Unusual drowsiness or trouble awakening   Fainting or loss of consciousness   Seizure   Worsening confusion, memory loss, dizziness, headache, behavior, speech, or vision  StayWell last reviewed this educational content on 09/11/2018   2000-2020 [employer], [place]. [address] [place], [place] [address]. All rights reserved. This information is not intended as a substitute for professional medical care. Always follow your healthcare professional's instructions.          Abrasions  Abrasions are skin scrapes. Their treatment depends on how large and deep the abrasion is.   Home care  You may be prescribed an antibiotic cream or ointment to apply to the wound. This helps prevent infection. Follow instructions when using this medicine.   General care   To care for the abrasion, do the following each day for as long as directed by your healthcare provider:  ? If you were given a bandage, change it once a day. If your bandage sticks to the wound, soak it in warm water until it loosens.  ? Wash the area with soap and warm water. You may do this in a sink or under a tub faucet or shower. Rinse off the soap. Then pat the area dry with a clean towel.  ? If antibiotic ointment or cream was prescribed,  reapply it to the wound as directed. Cover the wound with a fresh nonstick bandage. If the bandage becomes wet or dirty, change it as soon as possible.  ? Some antibiotic ointments or cream can cause an allergic reaction or dermatitis. This may cause redness, itching and or hives. If this occurs, stop using the ointment right away and wash off any remaining ointment. You may need to take some allergy medicine to relieve symptoms.   You may use acetaminophen or ibuprofen to control pain unless another pain medicine was prescribed.Talk with your healthcare provider before using these medicines if you have chronic liver or kidney disease or ever had a stomach ulcer or GI (gastrointestinal) bleeding. Dont use ibuprofen in children younger than 25 months old.   Most skin wounds heal within 10 days. But an infection may occur even with treatment. So its important to watch the wound for signs of infection as listed below.    Follow-up care  Follow up with your healthcare provider, or as advised.  When to get medical advice  Call your healthcare provider right away if any of these occur:   Fever of 100.23F (38C) or higher, or as directed by your healthcare provider   Increasing pain, redness, swelling, or drainage from the wound   Bleeding from the wound that does not stop after a few minutes of steady, firm pressure   Decreased ability to move any body part near the wound  StayWell last reviewed this educational content on 05/12/2018   2000-2020 [employer], [place]. [address] [place], [place] [address]. All rights reserved. This information is not intended as a substitute for professional medical care. Always follow your healthcare professional's instructions.    Thank you for choosing [facility] for your emergency care needs. We strive to provide EXCELLENT care to you and your family.  YOUR ACCURATE CONTACT INFORMATION IS VERY IMPORTANT  Before leaving please check with registration to  make sure we have an up-to-date contact number. A Toll-free post discharge Customer Service number is available to update your registration/insurance information as well as answer any billing questions or concerns. That number is [phone]   Discharge Message  YOU ARE THE MOST IMPORTANT FACTOR IN YOUR RECOVERY. Follow the above instructions carefully. Take your medicines as prescribed. Most important, see your  doctor in follow-up  as recommended by your ED physician    IF YOU DO NOT CONTINUE TO IMPROVE OR YOU HAVE ANY NEW, WORSENING O SEVERE SYMPTOMS, PLEASE CONTACT YOUR DOCTOR   IF YOUR REQUIRE IMMEDIATE ASSISTANCE, RETURN TO THE EMERGENCEY DEPARTMENT OR CALL 911.  MEDICAL RECORDS AND TESTS  Certain laboratory  test results do not come back the same day, for example: urine cultures may take 3 days. We will attempt to contact you if other important findings are noted. Some lab testing may take 2-5 days. Radiology films are reviewed again to ensure accuracy. If there is any discrepancy, we will notify you.     EXTRA AVAILABLE RESOURCES:  1. DOCTOR REFERRALS  a. Call  our Physician Referral Line at [phone]   b. [email or web address].  For physician referrals and other services that [facility] offers.    2. FREE HEALTH SERVICES  a. www.freemedicalsearch.org  b. [email or web address]  May be utilized if you need help with health or social services, please call 2-1-1 for a free referral to resources in your area. 2-1-1 is a free service connecting people with information on health insurance, free clinics, pregnancy, mental health, dental care, food assistance, housing, and substance abuse counseling.  Pharmacy information  Prescriptions can be filled at the pharmacy of your choice.  The Emergency Department does not authorize prescription refills.  Please contact your primary care physician or clinic for this.    [facility] has been providing home care solutions for independent living since  1984. Servicing [place] and [place]. [facility] is a full service home medical provider of home oxygen and respiratory care, medical equipment and supplies.  [phone]    Thanks Again, for allowing   [facility] Emergency Department   to serve you.  [phone]

## 2019-10-18 NOTE — ED Notes (Signed)
JUSTIFICATION OF AMBULANCE TRANSPORT   PHYSICIAN CERTIFICATION STATEMENT    MEDICARE REGULATIONS REQUIRE THE PHYSICIAN OR HEALTHCARE PROFESSIONAL TO COMPLETE THIS CERTIFICATION PRIOR TO ALL NON-EMERGENCY TRANSPORTS FOR MEDICARE RECIPIENTS. ALL STATEMENTS MUST MATCH THE PATIENTS MEDICAL RECORD.     PATIENT NAME:  Carla Cannon, Carla Cannon  DATE:  10/18/2019  ATTENDING PROVIDER:  Clyda Greener, MD    What condition(s) and/or illness(es) does this patient have that prevents him/her from being transported by any means other than an ambulance?    CHIEF COMPLAINT: FALL  ADDITIONAL DX (if applicable): forehead contusion,  Multiple abrasions s/p fall     Specialist/Specialty not available at [facility]. Reason for transfer:  No WC van available    N/A       Bed Confined- At the time of the transport: (all three conditions must apply)   unable to get up from bed without assistance; and  unable to ambulate; and  unable to sit in a wheelchair for the duration of the transport     -OR-     YES      Other means of transportation are contraindicated because it would be harmful to the patients condition. Even if no other means of transportation are available, ambulance trips must be medically necessary and not for convenience. Significant medical documentation must accompany these claims.      Electronically signed by: Clyda Greener, MD  Date/Time: 10/18/2019 5:54 PM      PHYSICIAN CERTIFICATION IS GOOD 60 DAYS FROM DATE OF PHYSICIANS SIGNATURE - Updated 03/21/12       Clyda Greener, MD  10/18/19 1754

## 2019-10-18 NOTE — ED Notes (Signed)
Right elbow skin tear cleaned, skin pulled to edge and steri strip applied.

## 2019-10-18 NOTE — ED Provider Notes (Signed)
History     Chief Complaint   Patient presents with    Fall     86 year old female presents via EMS status post ground-level fall with contusions to the right forehead and abrasions to right elbow and bilateral knuckles.    Patient lives alone.  Her girlfriend/caregiver's husband, Shanon Payor,  was driving her home after going to the hairdresser.  Getting out of the car (usually able unassisted)  she tripped on curb and fell forwards striking her hands, right elbow and right forehead.  She denies any loss of consciousness or neck pain.  She indicates pain at the site of her forehead contusion, elbow and knuckle abrasions.  She denies any hip pain or extremity pain with movement.      Nursing (triage) note reviewed for the following pertinent information:  Pt tripped on curb fell, skin tear right arm, bilateral hands, abrasion on right side forhead    Past Medical History:   Diagnosis Date    Anemia     Arthritis     Arthritis     Hypertension     Osteoarthritis     Osteopenia     Prediabetes     Pulmonary HTN     RLS (restless legs syndrome)        Past Surgical History:   Procedure Laterality Date    CATARACT EXTRACTION      ELBOW SURGERY      HYSTERECTOMY      WRIST FRACTURE SURGERY Left 10.4.2006       Family History   Problem Relation Age of Onset    Arthritis Mother        Social  Social History     Tobacco Use    Smoking status: Never Smoker    Smokeless tobacco: Never Used   Substance Use Topics    Alcohol use: No    Drug use: No       .     Allergies   Allergen Reactions    Cephalosporins Photosensitivity    Ciprocin-Fluocin-Procin [Fluocinolone] Photosensitivity    Codeine Nausea Only    Penicillins Nausea Only    Ketoconazole Rash       Home Medications     Med List Status: In Progress Set By: Marrianne Mood, RN at 10/18/2019  4:55 PM                aspirin EC 81 MG EC tablet     Take 81 mg by mouth daily.     gabapentin (NEURONTIN) 100 MG capsule     Take 100 mg by  mouth daily.        lisinopril-hydrochlorothiazide (PRINZIDE,ZESTORETIC) 20-12.5 MG per tablet     Take 1 tablet by mouth daily.        nystatin (NYSTOP) 100000 UNIT/GM Powder     1 application daily.        rOPINIRole (REQUIP) 0.5 MG tablet     Take 1 mg by mouth nightly.        UNKNOWN TO PATIENT                Review of Systems   Constitutional: Negative for fever.   HENT: Negative for congestion.    Respiratory: Negative for cough and shortness of breath.    Cardiovascular: Negative for chest pain.   Gastrointestinal: Negative for abdominal pain, diarrhea and vomiting.   Musculoskeletal: Positive for arthralgias. Negative for back pain and neck pain.   Skin: Positive for wound (  abrasions right forehead, elbow and bilateral knuckles. ).   Neurological: Negative for weakness.   All other systems reviewed and are negative.      Physical Exam    BP: 132/86, Heart Rate: 82, Temp: 98.1 F (36.7 C), Resp Rate: 18, SpO2: 98 %, Weight: 60.8 kg     Physical Exam  Vitals signs and nursing note reviewed.   Constitutional:       Appearance: She is well-developed.      Comments: Very pleasant, alert and conversant 86 year old female in no obvious distress.  Contusion/abrasion to right forehead.   HENT:      Head: Normocephalic.      Comments: Hematoma with abrasion to right forehead/hairline.  No crepitus or step-off.  Remainder of calvarium is atraumatic.  Eyes:      Conjunctiva/sclera: Conjunctivae normal.      Pupils: Pupils are equal, round, and reactive to light.   Neck:      Musculoskeletal: Normal range of motion and neck supple.      Comments: No tenderness or pain with range of motion.  Cardiovascular:      Rate and Rhythm: Normal rate and regular rhythm.      Heart sounds: Normal heart sounds.   Pulmonary:      Effort: Pulmonary effort is normal.      Breath sounds: Normal breath sounds.   Abdominal:      General: Bowel sounds are normal.      Palpations: Abdomen is soft.      Tenderness: There is no abdominal  tenderness.   Musculoskeletal: Normal range of motion.        Arms:         Hands:       Comments: Superficial skin tear to lateral proximal right forearm.  No bony elbow tenderness.  Abrasions between right 2/3rd MCP joints, and left 3-5 PIP joints.   All extremity joints with painless full range of motion.  Neurovascular intact distally.   Skin:     General: Skin is warm and dry.   Neurological:      Mental Status: She is alert.      Comments: GCS 14, patient is disoriented to year but otherwise alert and conversive.  Moves all 4 extremities equally.  Speech is clear and fluent.           MDM and ED Course     ED Medication Orders (From admission, onward)    None         Ct Head Wo- (rad Read)    Result Date: 10/18/2019  1. Right frontal soft tissue injury. 2. No acute intracranial abnormality. ReadingStation:[id number]        MDM  Number of Diagnoses or Management Options  Abrasion of multiple sites of hand and finger, unspecified laterality, initial encounter:   Contusion of forehead, initial encounter:   Fall from standing, initial encounter:   Skin tear of elbow without complication, initial encounter:   Diagnosis management comments: PT s/p mechanical fall with forehead contusion, right elbow and bilateral finger abrasions.  GCS 15.  C spine clinically cleared.  CT head neg.            Pt denies need for tylenol. Right elbow skin tear cleaned and dressed with steristrips and kerlex,  bandaids to fingers.    5 40 pm Pt is ready for discharge but as she lives alone, I called and spoke with Peyton Najjar.  Results discussed.   As is afterdark now,  he requests [facility] transport pt home into house. He and wife Windell Moulding will meet them there and stay with her tonight.  [facility] ETA about 7pm.    Discussed wound care and signs of infection or concussion to [facility].       Procedures    Clinical Impression & Disposition     Clinical Impression  Final diagnoses:   Contusion of forehead, initial encounter   Skin tear of elbow without  complication, initial encounter   Abrasion of multiple sites of hand and finger, unspecified laterality, initial encounter   Fall from standing, initial encounter        ED Disposition     ED Disposition Condition Date/Time Comment    Discharge  Wed Oct 18, 2019  5:50 PM Darrick Meigs discharge to home/self care.    Condition at disposition: Stable           New Prescriptions    No medications on file                 Clyda Greener, MD  10/18/19 1756

## 2020-04-24 ENCOUNTER — Ambulatory Visit: Payer: Medicare Other | Attending: Family | Admitting: Family

## 2020-04-24 DIAGNOSIS — R5381 Other malaise: Secondary | ICD-10-CM

## 2020-04-24 DIAGNOSIS — M1990 Unspecified osteoarthritis, unspecified site: Secondary | ICD-10-CM

## 2020-04-24 DIAGNOSIS — R600 Localized edema: Secondary | ICD-10-CM

## 2020-04-24 DIAGNOSIS — G2581 Restless legs syndrome: Secondary | ICD-10-CM

## 2020-04-24 DIAGNOSIS — R7303 Prediabetes: Secondary | ICD-10-CM

## 2020-04-24 DIAGNOSIS — I272 Pulmonary hypertension, unspecified: Secondary | ICD-10-CM

## 2020-04-24 DIAGNOSIS — I10 Essential (primary) hypertension: Secondary | ICD-10-CM

## 2020-04-25 ENCOUNTER — Ambulatory Visit (RURAL_HEALTH_CENTER): Payer: Self-pay

## 2020-04-25 LAB — CBC AND DIFFERENTIAL FRFP
Baso(Absolute): 0.1 10*3/uL (ref 0.0–0.2)
Basos: 1 %
Eos: 3 %
Eosinophils Absolute: 0.2 10*3/uL (ref 0.0–0.4)
Hematocrit: 38.3 % (ref 34.0–46.6)
Hemoglobin: 11.5 g/dL (ref 11.1–15.9)
Immature Granulocytes Absolute: 0 10*3/uL (ref 0.0–0.1)
Immature Granulocytes: 0 %
Lymphocytes Absolute: 1.3 10*3/uL (ref 0.7–3.1)
Lymphocytes: 22 %
MCH: 25.2 pg — ABNORMAL LOW (ref 26.6–33.0)
MCHC: 30 g/dL — ABNORMAL LOW (ref 31.5–35.7)
MCV: 84 fL (ref 79–97)
Monocytes Absolute: 0.6 10*3/uL (ref 0.1–0.9)
Monocytes: 11 %
Neutrophils Absolute: 3.6 10*3/uL (ref 1.4–7.0)
Neutrophils: 63 %
Platelets: 306 10*3/uL (ref 150–450)
RBC: 4.56 x10E6/uL (ref 3.77–5.28)
RDW: 17.3 % — ABNORMAL HIGH (ref 11.7–15.4)
WBC: 5.7 10*3/uL (ref 3.4–10.8)

## 2020-04-25 LAB — HEMOGLOBIN A1C: Hemoglobin A1C: 6 % — ABNORMAL HIGH (ref 4.8–5.6)

## 2020-04-25 LAB — COMPREHENSIVE METABOLIC PANEL
ALT: 8 IU/L (ref 0–32)
AST (SGOT): 19 IU/L (ref 0–40)
African American eGFR: 73 mL/min/{1.73_m2} (ref >59)
Albumin/Globulin Ratio: 1.5 (ref 1.2–2.2)
Albumin: 3.8 g/dL (ref 3.5–4.6)
Alkaline Phosphatase: 120 IU/L (ref 48–121)
BUN / Creatinine Ratio: 15 (ref 12–28)
BUN: 12 mg/dL (ref 10–36)
Bilirubin, Total: 1.1 mg/dL (ref 0.0–1.2)
CO2: 23 mmol/L (ref 20–29)
Calcium: 8.9 mg/dL (ref 8.7–10.3)
Chloride: 107 mmol/L — ABNORMAL HIGH (ref 96–106)
Creatinine: 0.79 mg/dL (ref 0.57–1.00)
Globulin, Total: 2.6 g/dL (ref 1.5–4.5)
Glucose: 105 mg/dL — ABNORMAL HIGH (ref 65–99)
Potassium: 4.2 mmol/L (ref 3.5–5.2)
Protein, Total: 6.4 g/dL (ref 6.0–8.5)
Sodium: 142 mmol/L (ref 134–144)
non-African American eGFR: 63 mL/min/{1.73_m2} (ref >59)

## 2020-09-03 ENCOUNTER — Telehealth (RURAL_HEALTH_CENTER): Payer: Self-pay

## 2020-09-03 ENCOUNTER — Other Ambulatory Visit (RURAL_HEALTH_CENTER): Payer: Self-pay | Admitting: Family

## 2020-09-03 DIAGNOSIS — M8580 Other specified disorders of bone density and structure, unspecified site: Secondary | ICD-10-CM | POA: Insufficient documentation

## 2020-09-03 DIAGNOSIS — R269 Unspecified abnormalities of gait and mobility: Secondary | ICD-10-CM | POA: Insufficient documentation

## 2020-09-03 DIAGNOSIS — R7303 Prediabetes: Secondary | ICD-10-CM | POA: Insufficient documentation

## 2020-09-03 DIAGNOSIS — R600 Localized edema: Secondary | ICD-10-CM | POA: Insufficient documentation

## 2020-09-03 DIAGNOSIS — L719 Rosacea, unspecified: Secondary | ICD-10-CM | POA: Insufficient documentation

## 2020-09-03 DIAGNOSIS — D509 Iron deficiency anemia, unspecified: Secondary | ICD-10-CM | POA: Insufficient documentation

## 2020-09-03 DIAGNOSIS — I272 Pulmonary hypertension, unspecified: Secondary | ICD-10-CM | POA: Insufficient documentation

## 2020-09-03 DIAGNOSIS — L501 Idiopathic urticaria: Secondary | ICD-10-CM | POA: Insufficient documentation

## 2020-09-03 DIAGNOSIS — I10 Essential (primary) hypertension: Secondary | ICD-10-CM | POA: Insufficient documentation

## 2020-09-03 DIAGNOSIS — R5381 Other malaise: Secondary | ICD-10-CM | POA: Insufficient documentation

## 2020-09-03 DIAGNOSIS — Z8661 Personal history of infections of the central nervous system: Secondary | ICD-10-CM | POA: Insufficient documentation

## 2020-09-03 DIAGNOSIS — G2581 Restless legs syndrome: Secondary | ICD-10-CM | POA: Insufficient documentation

## 2020-09-03 DIAGNOSIS — M1990 Unspecified osteoarthritis, unspecified site: Secondary | ICD-10-CM | POA: Insufficient documentation

## 2020-09-03 MED ORDER — TRIAMCINOLONE ACETONIDE 0.1 % EX CREA
TOPICAL_CREAM | Freq: Two times a day (BID) | CUTANEOUS | 1 refills | Status: DC
Start: 2020-09-03 — End: 2020-11-26

## 2020-09-03 NOTE — Telephone Encounter (Signed)
Pts niece notified. SLA/LPN

## 2020-09-03 NOTE — Telephone Encounter (Signed)
RX sent

## 2020-09-03 NOTE — Telephone Encounter (Signed)
Spoke with pts Niece, Debarah Crape who states pt has a rash on her chest and arms x 4 days ago.  Claudia describes rash as "blotchy" and pt is digging at it.  Debarah Crape is asking if topical cream can be sent to [facility]

## 2020-10-16 ENCOUNTER — Ambulatory Visit: Payer: Medicare Other | Attending: Family | Admitting: Family

## 2020-10-16 ENCOUNTER — Other Ambulatory Visit (RURAL_HEALTH_CENTER): Payer: Self-pay | Admitting: Family

## 2020-10-16 ENCOUNTER — Encounter (RURAL_HEALTH_CENTER): Payer: Self-pay | Admitting: Family

## 2020-10-16 VITALS — BP 136/68 | HR 85 | Wt 161.0 lb

## 2020-10-16 DIAGNOSIS — M1990 Unspecified osteoarthritis, unspecified site: Secondary | ICD-10-CM

## 2020-10-16 DIAGNOSIS — I10 Essential (primary) hypertension: Secondary | ICD-10-CM

## 2020-10-16 DIAGNOSIS — R7303 Prediabetes: Secondary | ICD-10-CM

## 2020-10-16 DIAGNOSIS — R5381 Other malaise: Secondary | ICD-10-CM

## 2020-10-16 DIAGNOSIS — I272 Pulmonary hypertension, unspecified: Secondary | ICD-10-CM

## 2020-10-16 DIAGNOSIS — L501 Idiopathic urticaria: Secondary | ICD-10-CM

## 2020-10-16 DIAGNOSIS — W5501XA Bitten by cat, initial encounter: Secondary | ICD-10-CM

## 2020-10-16 DIAGNOSIS — R600 Localized edema: Secondary | ICD-10-CM

## 2020-10-16 DIAGNOSIS — G2581 Restless legs syndrome: Secondary | ICD-10-CM

## 2020-10-16 DIAGNOSIS — R269 Unspecified abnormalities of gait and mobility: Secondary | ICD-10-CM

## 2020-10-16 MED ORDER — DOXYCYCLINE MONOHYDRATE 100 MG PO TABS
100.0000 mg | ORAL_TABLET | Freq: Two times a day (BID) | ORAL | 0 refills | Status: AC
Start: 2020-10-16 — End: 2020-10-23

## 2020-10-16 NOTE — Progress Notes (Signed)
[facility]  [address] [facility] [address]  [place], [place]  Phone [phone]   Fax [phone]    10/17/2020     Patient:   Carla Cannon                                                                                                             DOB:       1920-09-22                                        Provider Name:  Gershon Crane, FNP     SUBJECTIVE     History was provided by the patient and niece.    HPI:  Carla Cannon is a 87 y.o. female who is being seen today for   HPI   The patient is here to review their hypertension and RLS- Currently off meds   In general they report that feels will with minor complaints- Having a flare of dry skin legs and arms and has a cat bite on her left thumb from her own cat 2 days ago that is red and sore     Others present at today's visit include neice      ROS:  Review of Systems   Constitutional: Negative for activity change, fatigue and fever.   Respiratory: Negative for cough, choking and wheezing.    Cardiovascular: Negative for chest pain, palpitations and leg swelling.   Gastrointestinal: Negative for abdominal pain, constipation and nausea.   Genitourinary: Negative for difficulty urinating.   Musculoskeletal:        Has chronic OA.  Uses a walker.  Has been participating in home PT and has not had any falls since last OV    Neurological: Negative for dizziness and syncope.       Patient Active Problem List   Diagnosis    Shoulder joint pain    Impingement syndrome of shoulder region, unspecified laterality    Benign essential hypertension    Bilateral edema of lower extremity    Debility    History of meningitis    IDA (iron deficiency anemia)    Idiopathic urticaria    Impaired gait    Osteoarthritis    Osteopenia    Prediabetes    Pulmonary hypertension, unspecified     Restless leg    Cat bite, initial encounter      Social History     Tobacco Use   Smoking Status Never Smoker   Smokeless Tobacco Never Used      Social History     Substance and Sexual Activity   Alcohol Use No      Allergies   Allergen Reactions    Cephalosporins Photosensitivity    Ciprocin-Fluocin-Procin [Fluocinolone] Photosensitivity    Codeine Nausea Only    Penicillins Nausea Only    Ketoconazole Rash     Current Outpatient Medications  Medication Instructions    aspirin EC 81 mg, Daily    doxycycline (ADOXA) 100 mg, Oral, Every 12 hours scheduled    gabapentin (NEURONTIN) 100 mg, Oral, Daily    lisinopril-hydrochlorothiazide (PRINZIDE,ZESTORETIC) 20-12.5 MG per tablet 1 tablet, Oral, Daily    nystatin (NYSTOP) 100000 UNIT/GM Powder 1 application, Daily    pramipexole (MIRAPEX) 0.5 MG tablet TAKE 1 TABLET BY MOUTH EVERY DAY AT BEDTIME AS NEEDED FOR RESTLESS LEGS    rOPINIRole (REQUIP) 1 mg, Oral, At bedtime    triamcinolone (KENALOG) 0.1 % cream Topical, 2 times daily, Apply small amount to affected area bid for up to 2 weeks          PHYSICAL EXAM     Vital Signs:  BP 136/68 (BP Site: Right arm, Patient Position: Sitting, Cuff Size: Medium)    Pulse 85    Wt 73 kg (161 lb)    SpO2 97%    BMI 29.44 kg/m   Physical Exam  Constitutional:       General: She is not in acute distress.     Comments: Sitting up in WC.  Accompanied by her neice   Cardiovascular:      Rate and Rhythm: Normal rate and regular rhythm.      Heart sounds: S1 normal and S2 normal. No murmur heard.      Pulmonary:      Effort: Pulmonary effort is normal.      Breath sounds: Normal breath sounds.   Abdominal:      Palpations: Abdomen is soft.      Tenderness: There is no abdominal tenderness.   Musculoskeletal:      Cervical back: Neck supple.      Right lower leg: No edema.      Left lower leg: No edema.   Skin:     Comments: Generalized dry skin arms, legs and back with areas of scabbing from scratching.  There is a  puncture wound from a cat bite on left hand.  Web between thumb and finger.  No discharge.  There is a small amount of surrounding erythema    Neurological:      Mental Status: She is alert.             ASSESSMENT and PLAN       Problem List Items Addressed This Visit        Cardiovascular and Mediastinum    Benign essential hypertension - Primary    Overview     Stopped Lasix in feb.  No meds         Current Assessment & Plan     A/P: Remains well controlled on no meds.           Relevant Orders    CBC and differential    Lipid Panel, w/o T.Cholesterol/HDL Ratio    Comprehensive metabolic panel    Pulmonary hypertension, unspecified    Overview     mod per echo 2008         Current Assessment & Plan     A/P: No edema or CHF symptoms.  States understanding regarding signs and symptoms that indicate need for emergent followup              Musculoskeletal and Integument    Idiopathic urticaria    Current Assessment & Plan     A/P: Generalized dryness and some areas of chronic scratching.  Recommended she change her IVORY soap to Cetaphil and to continue to use  a non perfumed laundry detergent.  Has some topical steroid to use over areas that get intensely itching and uses this a couple of times a week or less.  Advised not to use topical steroid daily.  No secondary infection.  States understanding regarding signs and symptoms that indicate need for emergent follow up            Osteoarthritis    Overview     Uses a Walker at home  10/2020-  Home PT/OT         Current Assessment & Plan     A/P: Doing much better with stability using walker and working with PT.  She continues to live on her own at home with neighbors who bring her a meal every day and check on her daily and a niece that comes to see her every 2 weeks for errands and groceries.             Other    Bilateral edema of lower extremity    Debility    Impaired gait    Overview     2/2 to advancing OA and advanced age         Prediabetes    Current Assessment  & Plan     A/P: Will check labs.  She has has some wt loss but feels her appetite is about the same.          Relevant Orders    Hemoglobin A1C    Restless leg    Overview     Normal B12, ferritin, folic acid  Normal exam         Current Assessment & Plan     A/P: Currently not taking any meds and feels her symptoms are improved          Cat bite, initial encounter    Current Assessment & Plan     A/P:  It is her own cat who has been vaccinated.  No acute cellulitis  Will add antibiotic for 7 days and she and her niece will monitor and return if not improving over the next 3-5 days         Relevant Medications    doxycycline (ADOXA) 100 MG tablet           Follow up  No follow-ups on file.     Gershon Crane, FNP

## 2020-10-17 ENCOUNTER — Encounter (RURAL_HEALTH_CENTER): Payer: Self-pay | Admitting: Family

## 2020-10-17 ENCOUNTER — Encounter (RURAL_HEALTH_CENTER): Payer: Self-pay

## 2020-10-17 DIAGNOSIS — W5501XA Bitten by cat, initial encounter: Secondary | ICD-10-CM | POA: Insufficient documentation

## 2020-10-17 LAB — COMPREHENSIVE METABOLIC PANEL
ALT: 8 IU/L (ref 0–32)
AST (SGOT): 14 IU/L (ref 0–40)
African American eGFR: 85 mL/min/{1.73_m2} (ref >59)
Albumin/Globulin Ratio: 1.2 (ref 1.2–2.2)
Albumin: 3.6 g/dL (ref 3.5–4.6)
Alkaline Phosphatase: 132 IU/L — ABNORMAL HIGH (ref 44–121)
BUN / Creatinine Ratio: 20 (ref 12–28)
BUN: 13 mg/dL (ref 10–36)
Bilirubin, Total: 0.8 mg/dL (ref 0.0–1.2)
CO2: 25 mmol/L (ref 20–29)
Calcium: 8.9 mg/dL (ref 8.7–10.3)
Chloride: 107 mmol/L — ABNORMAL HIGH (ref 96–106)
Creatinine: 0.66 mg/dL (ref 0.57–1.00)
Globulin, Total: 3 g/dL (ref 1.5–4.5)
Glucose: 81 mg/dL (ref 65–99)
Potassium: 4.2 mmol/L (ref 3.5–5.2)
Protein, Total: 6.6 g/dL (ref 6.0–8.5)
Sodium: 145 mmol/L — ABNORMAL HIGH (ref 134–144)
non-African American eGFR: 74 mL/min/{1.73_m2} (ref >59)

## 2020-10-17 LAB — CBC AND DIFFERENTIAL
Baso(Absolute): 0.1 10*3/uL (ref 0.0–0.2)
Basos: 1 %
Eos: 2 %
Eosinophils Absolute: 0.2 10*3/uL (ref 0.0–0.4)
Hematocrit: 39.2 % (ref 34.0–46.6)
Hemoglobin: 12.5 g/dL (ref 11.1–15.9)
Immature Granulocytes Absolute: 0 10*3/uL (ref 0.0–0.1)
Immature Granulocytes: 0 %
Lymphocytes Absolute: 1.3 10*3/uL (ref 0.7–3.1)
Lymphocytes: 16 %
MCH: 27.1 pg (ref 26.6–33.0)
MCHC: 31.9 g/dL (ref 31.5–35.7)
MCV: 85 fL (ref 79–97)
Monocytes Absolute: 0.8 10*3/uL (ref 0.1–0.9)
Monocytes: 10 %
Neutrophils Absolute: 5.9 10*3/uL (ref 1.4–7.0)
Neutrophils: 71 %
Platelets: 295 10*3/uL (ref 150–450)
RBC: 4.62 x10E6/uL (ref 3.77–5.28)
RDW: 13.9 % (ref 11.7–15.4)
WBC: 8.2 10*3/uL (ref 3.4–10.8)

## 2020-10-17 LAB — LIPID PANEL, WITHOUT TOTAL CHOLESTEROL/HDL RATIO, SERUM
Cholesterol: 146 mg/dL (ref 100–199)
HDL: 57 mg/dL (ref >39)
LDL Chol Calculated (NIH): 78 mg/dL (ref 0–99)
Triglycerides: 53 mg/dL (ref 0–149)
VLDL Calculated: 11 mg/dL (ref 5–40)

## 2020-10-17 LAB — HEMOGLOBIN A1C: Hemoglobin A1C: 6.2 % — ABNORMAL HIGH (ref 4.8–5.6)

## 2020-10-17 NOTE — Assessment & Plan Note (Signed)
A/P: No edema or CHF symptoms.  States understanding regarding signs and symptoms that indicate need for emergent followup

## 2020-10-17 NOTE — Assessment & Plan Note (Signed)
A/P:  It is her own cat who has been vaccinated.  No acute cellulitis  Will add antibiotic for 7 days and she and her niece will monitor and return if not improving over the next 3-5 days

## 2020-10-17 NOTE — Assessment & Plan Note (Signed)
A/P: Currently not taking any meds and feels her symptoms are improved

## 2020-10-17 NOTE — Assessment & Plan Note (Signed)
A/P: Doing much better with stability using walker and working with PT.  She continues to live on her own at home with neighbors who bring her a meal every day and check on her daily and a niece that comes to see her every 2 weeks for errands and groceries.

## 2020-10-17 NOTE — Assessment & Plan Note (Signed)
A/P: Generalized dryness and some areas of chronic scratching.  Recommended she change her IVORY soap to Cetaphil and to continue to use a non perfumed laundry detergent.  Has some topical steroid to use over areas that get intensely itching and uses this a couple of times a week or less.  Advised not to use topical steroid daily.  No secondary infection.  States understanding regarding signs and symptoms that indicate need for emergent follow up

## 2020-10-17 NOTE — Assessment & Plan Note (Signed)
A/P: Will check labs.  She has has some wt loss but feels her appetite is about the same.

## 2020-10-17 NOTE — Assessment & Plan Note (Signed)
A/P: Remains well controlled on no meds.

## 2020-11-17 ENCOUNTER — Emergency Department
Admission: EM | Admit: 2020-11-17 | Discharge: 2020-11-17 | Disposition: A | Discharge status: Home / Self Care | Payer: Medicare Other | Attending: Emergency Medicine | Admitting: Emergency Medicine

## 2020-11-17 ENCOUNTER — Emergency Department: Payer: Medicare Other

## 2020-11-17 DIAGNOSIS — S93602A Unspecified sprain of left foot, initial encounter: Secondary | ICD-10-CM

## 2020-11-17 DIAGNOSIS — M25572 Pain in left ankle and joints of left foot: Secondary | ICD-10-CM

## 2020-11-17 DIAGNOSIS — M7989 Other specified soft tissue disorders: Secondary | ICD-10-CM

## 2020-11-17 DIAGNOSIS — W010XXA Fall on same level from slipping, tripping and stumbling without subsequent striking against object, initial encounter: Secondary | ICD-10-CM

## 2020-11-17 MED ORDER — ACETAMINOPHEN 325 MG PO TABS
650.0000 mg | ORAL_TABLET | Freq: Once | ORAL | Status: AC
Start: 2020-11-17 — End: 2020-11-17
  Administered 2020-11-17: 16:00:00 650 mg via ORAL

## 2020-11-17 MED ORDER — ACETAMINOPHEN 325 MG PO TABS
ORAL_TABLET | ORAL | Status: AC
Start: 2020-11-17 — End: ?
  Filled 2020-11-17: qty 2

## 2020-11-17 NOTE — Discharge Instructions (Signed)
Foot Sprain    A sprain is a stretching or tearing of the ligaments that hold a joint together. There are usually no broken bones. Sprains generally take from 3 to 6 weeks to heal. A sprain may be treated with a splint, walking cast, or special boot. Mild sprains may not need any additional support.  Home care  The following guidelines will help you care for your injury at home:  · Keep your leg elevated when sitting or lying down. This is very important during the first 48 hours to reduce swelling. Stay off the injured foot as much as possible until you can walk on it without pain. If needed, you may use crutches during the first week for this purpose. Crutches can be rented at many pharmacies or surgical/orthopedic supply stores.  · You may be given a cast shoe to wear to prevent movement in your foot. If not, you can use a sandal or any shoe that does not put pressure on the injured area until the swelling and pain go away. If using a sandal, be careful not to hit your foot against anything, since another injury could make the sprain worse.  · Apply an ice pack over the injured area for 15 to 20 minutes every 3 to 6 hours. You should do this for the first 24 to 48 hours. You can make an ice pack by filling a plastic bag that seals at the top with ice cubes and then wrapping it with a thin towel. Continue to use ice packs for relief of pain and swelling as needed. As the ice melts, try not to get the wrap, splint, or cast wet. After 48 hours, apply heat from a warm shower or bath for 20 minutes several times daily. Alternating ice and heat may also be helpful.  · You may use over-the-counter pain medicine to control pain, unless another medicine was prescribed. If you have chronic liver or kidney disease or ever had a stomach ulcer or gastrointestinal bleeding, talk with your healthcare provider before using these medicines.  · If you were given a splint or cast, keep it dry. Bathe with your splint or cast well  out of the water, protected with 2 large plastic bags, sealed with tape or rubber-bands at the top end. If a fiberglass splint or cast gets wet, you can dry it with a hair dryer on cool setting.  · You may return to sports after healing, when you can run without pain.  Follow-up care  Follow up with your healthcare provider as directed. Sometimes fractures don’t show up on the first X-ray. Bruises and sprains can sometimes hurt as much as a fracture. These injuries can take time to heal completely. If your symptoms don’t improve or they get worse, talk with your healthcare provider. You may need a repeat X-ray or other tests.  When to seek medical advice  Call your healthcare provider right away if any of these occur:  · The plaster cast or splint gets wet or soft  · The fiberglass cast or splint gets wet and does not dry for 24 hours  · Pain or swelling increases, or redness appears  · A bad odor comes from within the cast  · Fever of 100.4°F (38°C) or above lasting for 24 to 48 hours, or as advised  · Chills  · Toes on the injured foot become cold, blue, numb, or tingly  [employer] last reviewed this educational content on 02/09/2017  © 2000-2021 [employer]. All rights reserved. This information is not intended as a substitute for professional medical   care. Always follow your healthcare professional's instructions.

## 2020-11-17 NOTE — ED Provider Notes (Addendum)
History     Chief Complaint   Patient presents with    Ankle Pain   This patient lives at home, she states she slipped out of his chair and twisted her left foot and ankle.  She is here because of pain and ecchymosis and pain of the forefoot on the left.  She denies striking her head no loss of consciousness.  She reports a mechanical fall from a chair.  She denies any knee pain.      Nursing (triage) note reviewed for the following pertinent information:       Past Medical History:   Diagnosis Date    Anemia     Arthritis     Arthritis     Hypertension     Osteoarthritis     Osteopenia     Prediabetes     Pulmonary HTN     RLS (restless legs syndrome)        Past Surgical History:   Procedure Laterality Date    CATARACT EXTRACTION      ELBOW SURGERY      HYSTERECTOMY      WRIST FRACTURE SURGERY Left 10.4.2006       Family History   Problem Relation Age of Onset    Arthritis Mother        Social  Social History     Tobacco Use    Smoking status: Never Smoker    Smokeless tobacco: Never Used   Haematologist Use: Never used   Substance Use Topics    Alcohol use: No    Drug use: No       .     Allergies   Allergen Reactions    Cephalosporins Photosensitivity    Ciprocin-Fluocin-Procin [Fluocinolone] Photosensitivity    Codeine Nausea Only    Penicillins Nausea Only    Ketoconazole Rash       Home Medications     Med List Status: Complete Set By: Forrest Moron, RN at 11/17/2020  1:14 PM                aspirin EC 81 MG EC tablet     Take 81 mg by mouth daily.     gabapentin (NEURONTIN) 100 MG capsule     Take 100 mg by mouth daily.        nystatin (NYSTOP) 100000 UNIT/GM Powder     1 application daily.        triamcinolone (KENALOG) 0.1 % cream     Apply topically 2 (two) times daily Apply small amount to affected area bid for up to 2 weeks           Flagged for Removal             lisinopril-hydrochlorothiazide (PRINZIDE,ZESTORETIC) 20-12.5 MG per tablet     Take 1 tablet by mouth  daily.        pramipexole (MIRAPEX) 0.5 MG tablet     TAKE 1 TABLET BY MOUTH EVERY DAY AT BEDTIME AS NEEDED FOR RESTLESS LEGS     rOPINIRole (REQUIP) 0.5 MG tablet     Take 1 mg by mouth nightly.              Review of Systems   Musculoskeletal:        Left ankle and foot pain and swelling and ecchymosis       Physical Exam    BP: 129/65, Heart Rate: (!) 107, Temp: 98.4 F (36.9 C),  Resp Rate: 18, SpO2: 98 %, Weight: 73.9 kg     Physical Exam  Vitals and nursing note reviewed.   Constitutional:       Appearance: Normal appearance.   Musculoskeletal:      Comments: There is no knee pain.    There is ecchymosis and pain in the dorsum of the forefoot of the left foot, there is swelling around the left ankle and ecchymosis but I am not picking up any point tenderness there.  Ankle feels stable there is brisk capillary refill of the toes.  Achilles is intact   Skin:     General: Skin is warm and dry.      Capillary Refill: Capillary refill takes less than 2 seconds.   Neurological:      General: No focal deficit present.      Mental Status: She is alert and oriented to person, place, and time.   Psychiatric:         Mood and Affect: Mood normal.         Behavior: Behavior normal.       No results found.    MDM and ED Course     ED Medication Orders (From admission, onward)    Start Ordered     Status Ordering Provider    11/17/20 1600 11/17/20 1551  acetaminophen (TYLENOL) tablet 650 mg  Once        Route: Oral  Ordered Dose: 650 mg     Last MAR action: Given Kamee Bobst P             MDM  Number of Diagnoses or Management Options  Diagnosis management comments: There is no acute fracture seen.  I placed an Ace wrap, the patient lives at home by herself she has close friends that see her every day, help her with her meals.  She tells me she believes she will need an ambulance back to her residence that is how she got here.                   Procedures    Clinical Impression & Disposition     Clinical Impression  Final  diagnoses:   Foot sprain, left, initial encounter        ED Disposition     ED Disposition Condition Date/Time Comment    Discharge  Sun Nov 17, 2020  3:55 PM Darrick Meigs discharge to home/self care.    Condition at disposition: Stable           Discharge Medication List as of 11/17/2020  3:55 PM                    Oretha Ellis, MD  11/17/20 1555       Oretha Ellis, MD  11/19/20 1459

## 2020-11-18 ENCOUNTER — Encounter (RURAL_HEALTH_CENTER): Payer: Self-pay | Admitting: Family

## 2020-11-18 NOTE — Progress Notes (Signed)
This encounter was created in error - please disregard.    Items noted as "reviewed" are for administrative purposes only and are not guaranteed by the provider to be accurate on this date.

## 2020-11-21 ENCOUNTER — Telehealth (RURAL_HEALTH_CENTER): Payer: Self-pay | Admitting: Family

## 2020-11-21 ENCOUNTER — Inpatient Hospital Stay (RURAL_HEALTH_CENTER): Payer: Self-pay | Admitting: Family Medicine

## 2020-11-21 ENCOUNTER — Inpatient Hospital Stay
Admission: EM | Admit: 2020-11-21 | Discharge: 2020-11-26 | DRG: 563 | Disposition: A | Discharge status: Skilled Nursing Facility | Payer: Medicare Other | Attending: Family Medicine | Admitting: Family Medicine

## 2020-11-21 DIAGNOSIS — Z881 Allergy status to other antibiotic agents status: Secondary | ICD-10-CM

## 2020-11-21 DIAGNOSIS — Z7982 Long term (current) use of aspirin: Secondary | ICD-10-CM

## 2020-11-21 DIAGNOSIS — Z9849 Cataract extraction status, unspecified eye: Secondary | ICD-10-CM

## 2020-11-21 DIAGNOSIS — Z9181 History of falling: Secondary | ICD-10-CM

## 2020-11-21 DIAGNOSIS — Z90710 Acquired absence of both cervix and uterus: Secondary | ICD-10-CM

## 2020-11-21 DIAGNOSIS — Z888 Allergy status to other drugs, medicaments and biological substances status: Secondary | ICD-10-CM

## 2020-11-21 DIAGNOSIS — G2581 Restless legs syndrome: Secondary | ICD-10-CM | POA: Diagnosis present

## 2020-11-21 DIAGNOSIS — S92902A Unspecified fracture of left foot, initial encounter for closed fracture: Secondary | ICD-10-CM | POA: Diagnosis present

## 2020-11-21 DIAGNOSIS — Z885 Allergy status to narcotic agent status: Secondary | ICD-10-CM

## 2020-11-21 DIAGNOSIS — Z882 Allergy status to sulfonamides status: Secondary | ICD-10-CM

## 2020-11-21 DIAGNOSIS — R296 Repeated falls: Secondary | ICD-10-CM | POA: Diagnosis present

## 2020-11-21 DIAGNOSIS — Z20822 Contact with and (suspected) exposure to covid-19: Secondary | ICD-10-CM | POA: Diagnosis present

## 2020-11-21 DIAGNOSIS — F0390 Unspecified dementia without behavioral disturbance: Secondary | ICD-10-CM | POA: Diagnosis present

## 2020-11-21 DIAGNOSIS — I10 Essential (primary) hypertension: Secondary | ICD-10-CM | POA: Diagnosis present

## 2020-11-21 DIAGNOSIS — Z887 Allergy status to serum and vaccine status: Secondary | ICD-10-CM

## 2020-11-21 DIAGNOSIS — Z66 Do not resuscitate: Secondary | ICD-10-CM | POA: Diagnosis present

## 2020-11-21 DIAGNOSIS — S92322A Displaced fracture of second metatarsal bone, left foot, initial encounter for closed fracture: Principal | ICD-10-CM | POA: Diagnosis present

## 2020-11-21 DIAGNOSIS — S9032XA Contusion of left foot, initial encounter: Secondary | ICD-10-CM

## 2020-11-21 DIAGNOSIS — W1830XA Fall on same level, unspecified, initial encounter: Secondary | ICD-10-CM | POA: Diagnosis present

## 2020-11-21 DIAGNOSIS — Z8261 Family history of arthritis: Secondary | ICD-10-CM

## 2020-11-21 DIAGNOSIS — R54 Age-related physical debility: Secondary | ICD-10-CM | POA: Diagnosis present

## 2020-11-21 DIAGNOSIS — M1990 Unspecified osteoarthritis, unspecified site: Secondary | ICD-10-CM | POA: Diagnosis present

## 2020-11-21 DIAGNOSIS — I272 Pulmonary hypertension, unspecified: Secondary | ICD-10-CM | POA: Diagnosis present

## 2020-11-21 DIAGNOSIS — Z880 Allergy status to penicillin: Secondary | ICD-10-CM

## 2020-11-21 DIAGNOSIS — Z79899 Other long term (current) drug therapy: Secondary | ICD-10-CM

## 2020-11-21 HISTORY — DX: Repeated falls: R29.6

## 2020-11-21 HISTORY — DX: Contusion of left foot, initial encounter: S90.32XA

## 2020-11-21 LAB — VH URINALYSIS WITH MICROSCOPIC IF INDICATED
Bilirubin, UA: NEGATIVE
Blood, UA: 0.2 — AB
Glucose, UA: NEGATIVE mg/dL
Ketones UA: NEGATIVE mg/dL
Leukocyte Esterase, UA: NEGATIVE Leu/uL
Nitrite, UA: NEGATIVE
Protein, UR: 20 mg/dL — AB
RBC, UA: 6 /hpf — ABNORMAL HIGH (ref 0–5)
Squam Epithel, UA: 3 /lpf — ABNORMAL HIGH (ref 0–2)
Urine Specific Gravity: 1.022
Urobilinogen, UA: 6 mg/dL — AB
WBC, UA: 0 /hpf (ref 0–4)
pH, Urine: 6.5 pH (ref 5.0–8.0)

## 2020-11-21 LAB — COMPREHENSIVE METABOLIC PANEL
ALT: 10 U/L (ref 0–55)
AST (SGOT): 18 U/L (ref 10–42)
Albumin/Globulin Ratio: 0.81 Ratio (ref 0.80–2.00)
Albumin: 3 gm/dL — ABNORMAL LOW (ref 3.5–5.0)
Alkaline Phosphatase: 121 U/L (ref 40–145)
Anion Gap: 11.8 mMol/L (ref 7.0–18.0)
BUN / Creatinine Ratio: 23.9 Ratio (ref 10.0–30.0)
BUN: 17 mg/dL (ref 7–22)
Bilirubin, Total: 1.3 mg/dL — ABNORMAL HIGH (ref 0.1–1.2)
CO2: 26 mMol/L (ref 20–30)
Calcium: 8.8 mg/dL (ref 8.5–10.5)
Chloride: 107 mMol/L (ref 98–110)
Creatinine: 0.71 mg/dL (ref 0.60–1.20)
EGFR: 71 mL/min/{1.73_m2} (ref 60–150)
Globulin: 3.7 gm/dL (ref 2.0–4.0)
Glucose: 177 mg/dL — ABNORMAL HIGH (ref 71–99)
Osmolality Calculated: 287 mOsm/kg (ref 275–300)
Potassium: 3.8 mMol/L (ref 3.5–5.3)
Protein, Total: 6.7 gm/dL (ref 6.0–8.3)
Sodium: 141 mMol/L (ref 136–147)

## 2020-11-21 LAB — CBC AND DIFFERENTIAL
Basophils %: 0.2 % (ref 0.0–3.0)
Basophils Absolute: 0 10*3/uL (ref 0.0–0.3)
Eosinophils %: 1.5 % (ref 0.0–7.0)
Eosinophils Absolute: 0.1 10*3/uL (ref 0.0–0.8)
Hematocrit: 40.1 % (ref 36.0–48.0)
Hemoglobin: 12.8 gm/dL (ref 12.0–16.0)
Lymphocytes Absolute: 1.2 10*3/uL (ref 0.6–5.1)
Lymphocytes: 14 % — ABNORMAL LOW (ref 15.0–46.0)
MCH: 29 pg (ref 28–35)
MCHC: 32 gm/dL (ref 32–36)
MCV: 92 fL (ref 80–100)
MPV: 9.8 fL (ref 6.0–10.0)
Monocytes Absolute: 0.6 10*3/uL (ref 0.1–1.7)
Monocytes: 7.2 % (ref 3.0–15.0)
Neutrophils %: 77.1 % (ref 42.0–78.0)
Neutrophils Absolute: 6.5 10*3/uL (ref 1.7–8.6)
PLT CT: 253 10*3/uL (ref 130–440)
RBC: 4.38 10*6/uL (ref 3.80–5.00)
RDW: 15.2 % — ABNORMAL HIGH (ref 11.0–14.0)
WBC: 8.5 10*3/uL (ref 4.0–11.0)

## 2020-11-21 LAB — CK: Creatine Kinase (CK): 125 U/L (ref 30–189)

## 2020-11-21 LAB — TROPONIN I: Troponin I: 0.02 ng/mL (ref 0.00–0.02)

## 2020-11-21 LAB — MAGNESIUM: Magnesium: 2 mg/dL (ref 1.6–2.6)

## 2020-11-21 MED ORDER — ENOXAPARIN SODIUM 40 MG/0.4ML SC SOLN
40.0000 mg | SUBCUTANEOUS | Status: DC
Start: 2020-11-21 — End: 2020-11-26
  Administered 2020-11-21 – 2020-11-25 (×5): 40 mg via SUBCUTANEOUS
  Filled 2020-11-21 (×5): qty 0.4

## 2020-11-21 MED ORDER — ASPIRIN 81 MG PO TBEC
81.0000 mg | DELAYED_RELEASE_TABLET | Freq: Every day | ORAL | Status: DC
Start: 2020-11-22 — End: 2020-11-24
  Administered 2020-11-22 – 2020-11-24 (×3): 81 mg via ORAL
  Filled 2020-11-21 (×3): qty 1

## 2020-11-21 MED ORDER — SODIUM CHLORIDE (PF) 0.9 % IJ SOLN
0.4000 mg | INTRAMUSCULAR | Status: DC | PRN
Start: 2020-11-21 — End: 2020-11-26

## 2020-11-21 MED ORDER — SODIUM CHLORIDE (PF) 0.9 % IJ SOLN
3.0000 mL | Freq: Two times a day (BID) | INTRAMUSCULAR | Status: DC
Start: 2020-11-21 — End: 2020-11-26
  Administered 2020-11-21 – 2020-11-26 (×10): 3 mL via INTRAVENOUS
  Filled 2020-11-21 (×2): qty 10

## 2020-11-21 NOTE — Telephone Encounter (Signed)
Pt's niece presented to the [facility] Waiting room inquiring about SNF placement or management on behalf of pt. Niece stated pt in unable to ambulate w/o assistance and feels as though she was D/C prematurely. Pt's niece advised to have pt seen for hospital F/U for face-to-face encounter to seek recommendation for home care need. Pt's niece agreeable to plan of care.

## 2020-11-21 NOTE — Progress Notes (Signed)
NURSE NOTE SUMMARY  [facility] ACUTE CARE   Patient Name: Carla Cannon   Attending Physician: Judi Cong, DO   Today's date:   11/21/2020 LOS: 0 days   Shift Summary:                                                              Pt arrived to the unit from ED, with Dx physical debility, post fall, / transferred into bed with 2 staff transfer, observed to be A&Ox4, positioned in bed comfortably, offered fluid since not NPO, on regular diet with thin liquid, VSS on RA, respirations even and unlabored, no distress noted, orientation to room and use of call light, within her easy reach, bed in low position, side rails up x 3, bedside table within reach, bed alarm on, orders carried out after acknowledgement, pt has pure-wich in place, observed redness in the left groin lower abdominal area, client left undisturbed for the night, encouraged to call for help as needed, safety precautions maintained, close monitoring continues.      Provider Notifications:         Rapid Response Notifications:  Mobility:      PMP Activity: Step 3 - Bed Mobility (11/21/2020  9:00 PM)     Weight tracking:  Family Dynamic:   Last 3 Weights for the past 72 hrs (Last 3 readings):   Weight   11/21/20 2025 72.3 kg (159 lb 6.3 oz)   11/21/20 1508 75.9 kg (167 lb 6.4 oz)   11/21/20 1500 75.8 kg (167 lb)             Last Bowel Movement   No data recorded

## 2020-11-21 NOTE — ED Provider Notes (Signed)
History     Chief Complaint   Patient presents with    Fall    Ankle Pain     This patient presents by EMS.  She is accompanied by her niece.    This patient lives alone.  Suffered a fall onto 11/17/20 and was seen in our emergency department injury to the left ankle..  Rays of the left foot and ankle were negative for fracture.  She was released.    Niece drove up from [place] and when she got to the house she found that her aunt was unable to get herself up to the bathroom.  She had an appointment at family practice today.  She was going to the bathroom when she collapsed and her niece eased her to the ground.  She was unable to get her up so EMS was called.    The niece reports that the patient cannot ambulate at home and cannot be at home currently.    She is hoping for a rehab admission.    She was seen on 10/17/2020 after a fall at which time she had a head CT was negative for intracranial injury.    Past Medical History:   Diagnosis Date    Anemia     Arthritis     Arthritis     Hypertension     Osteoarthritis     Osteopenia     Prediabetes     Pulmonary HTN     RLS (restless legs syndrome)        Past Surgical History:   Procedure Laterality Date    CATARACT EXTRACTION      ELBOW SURGERY      HYSTERECTOMY      WRIST FRACTURE SURGERY Left 10.4.2006       Family History   Problem Relation Age of Onset    Arthritis Mother        Social  Social History     Tobacco Use    Smoking status: Never Smoker    Smokeless tobacco: Never Used   Haematologist Use: Never used   Substance Use Topics    Alcohol use: No    Drug use: No       .     Allergies   Allergen Reactions    Sulfamethoxazole-Trimethoprim      patient fell after taking this med and felt wealk    Cephalosporins Photosensitivity    Ciprocin-Fluocin-Procin [Fluocinolone] Photosensitivity    Ciprofloxacin     Codeine Nausea Only    Other      Nausea    Penicillins Nausea Only    Sertraline      Nausea    Varicella  Virus Vaccine Live      rash    Ketoconazole Rash       Home Medications     Med List Status: Complete Set By: Forrest Moron, RN at 11/21/2020  3:16 PM                aspirin EC 81 MG EC tablet     Take 81 mg by mouth daily.     gabapentin (NEURONTIN) 100 MG capsule     Take 100 mg by mouth daily.        lisinopril-hydrochlorothiazide (PRINZIDE,ZESTORETIC) 20-12.5 MG per tablet     Take 1 tablet by mouth daily.        nystatin (NYSTOP) 100000 UNIT/GM Powder     1 application daily.  pramipexole (MIRAPEX) 0.5 MG tablet     TAKE 1 TABLET BY MOUTH EVERY DAY AT BEDTIME AS NEEDED FOR RESTLESS LEGS     rOPINIRole (REQUIP) 0.5 MG tablet     Take 1 mg by mouth nightly.        triamcinolone (KENALOG) 0.1 % cream     Apply topically 2 (two) times daily Apply small amount to affected area bid for up to 2 weeks           Review of Systems   Constitutional: Negative for chills and fever.   HENT: Negative for congestion.    Respiratory: Negative for cough and shortness of breath.    Cardiovascular: Negative for chest pain.   Gastrointestinal: Negative for diarrhea, nausea and vomiting.   Musculoskeletal:        Left foot and ankle pain   Neurological: Negative for syncope.   All other systems reviewed and are negative.      Physical Exam    BP: 168/80, Heart Rate: 94, Temp: 98.2 F (36.8 C), Resp Rate: 16, SpO2: 100 %, Weight: 75.8 kg     Physical Exam  Vitals and nursing note reviewed.   HENT:      Head: Atraumatic.      Mouth/Throat:      Mouth: Mucous membranes are dry.   Eyes:      Pupils: Pupils are equal, round, and reactive to light.   Cardiovascular:      Rate and Rhythm: Normal rate and regular rhythm.      Heart sounds: Normal heart sounds.   Pulmonary:      Effort: Pulmonary effort is normal.      Breath sounds: Normal breath sounds.   Abdominal:      Palpations: Abdomen is soft.   Musculoskeletal:      Comments: She has puffy edema and ecchymosis of the left foot.  The skin is intact.  Dorsalis pedis pulses  2+.  The foot is diffusely tender.  The bony pelvis is stable and nontender and her hips are nonirritable.  She has no spine tenderness.   Skin:     General: Skin is warm and dry.   Neurological:      Mental Status: She is alert.           MDM and ED Course     ED Medication Orders (From admission, onward)    None         Results     Procedure Component Value Units Date/Time    Urinalysis with Microscopic if Indicated [[id number]]  (Abnormal) Collected: 11/21/20 1526    Specimen: Urine, Random Updated: 11/21/20 1828     Color, UA Yellow     Clarity, UA Clear     Urine Specific Gravity 1.022     pH, Urine 6.5 pH      Protein, UR 20 mg/dL      Glucose, UA Negative mg/dL      Ketones UA Negative mg/dL      Bilirubin, UA Negative     Blood, UA 0.2     Nitrite, UA Negative     Urobilinogen, UA 6.0 mg/dL      Leukocyte Esterase, UA Negative Leu/uL      UR Micro Performed     WBC, UA 0 /hpf      RBC, UA 6 /hpf      Squam Epithel, UA 3 /lpf     Troponin I [[id number]] Collected: 11/21/20 1526  Specimen: Plasma Updated: 11/21/20 1614     Troponin I 0.02 ng/mL     Magnesium [[id number]] Collected: 11/21/20 1526    Specimen: Plasma Updated: 11/21/20 1614     Magnesium 2.0 mg/dL     Creatine Kinase (CK) [[id number]] Collected: 11/21/20 1526    Specimen: Plasma Updated: 11/21/20 1614     Creatine Kinase (CK) 125 U/L     Comprehensive metabolic panel [[id number]]  (Abnormal) Collected: 11/21/20 1526    Specimen: Plasma Updated: 11/21/20 1614     Sodium 141 mMol/L      Potassium 3.8 mMol/L      Chloride 107 mMol/L      CO2 26 mMol/L      Calcium 8.8 mg/dL      Glucose 952 mg/dL      Creatinine 8.41 mg/dL      BUN 17 mg/dL      Protein, Total 6.7 gm/dL      Albumin 3.0 gm/dL      Alkaline Phosphatase 121 U/L      ALT 10 U/L      AST (SGOT) 18 U/L      Bilirubin, Total 1.3 mg/dL      Albumin/Globulin Ratio 0.81 Ratio      Anion Gap 11.8 mMol/L      BUN / Creatinine Ratio 23.9 Ratio      EGFR 71 mL/min/1.6m2      Osmolality Calculated  287 mOsm/kg      Globulin 3.7 gm/dL     CBC and differential [[id number]]  (Abnormal) Collected: 11/21/20 1526    Specimen: Blood Updated: 11/21/20 1557     WBC 8.5 K/cmm      RBC 4.38 M/cmm      Hemoglobin 12.8 gm/dL      Hematocrit 25.3 %      MCV 92 fL      MCH 29 pg      MCHC 32 gm/dL      RDW 66.4 %      PLT CT 253 K/cmm      MPV 9.8 fL      Neutrophils % 77.1 %      Lymphocytes 14.0 %      Monocytes 7.2 %      Eosinophils % 1.5 %      Basophils % 0.2 %      Neutrophils Absolute 6.5 K/cmm      Lymphocytes Absolute 1.2 K/cmm      Monocytes Absolute 0.6 K/cmm      Eosinophils Absolute 0.1 K/cmm      Basophils Absolute 0.0 K/cmm         Radiology Results (24 Hour)     ** No results found for the last 24 hours. **        MDM           Dr Rogelia Rohrer FRFP consulted to see pt for admit -patient unable to ambulate at home or care for self at home.          Procedures    Clinical Impression & Disposition     Clinical Impression  Final diagnoses:   Frequent falls   Contusion of left foot, initial encounter        ED Disposition     ED Disposition Condition Date/Time Comment    Admit  Thu Nov 21, 2020  4:39 PM Service: Medicine [[id number]]             New Prescriptions    No  medications on file      This note was completed using dragon medical speech recognition software. Grammatical errors, random word insertions, pronoun errors, incorrect word insertion, misspellings  and incomplete sentences are occasional consequences of this technology due to software limitations. If there are questions or concerns about the content of this note or information contained within the body of this dictation they should be addressed with the provider for clarification.           Manning Charity, MD  11/21/20 Norberta Keens

## 2020-11-21 NOTE — Plan of Care (Signed)
Problem: Moderate/High Fall Risk Score >5  Goal: Patient will remain free of falls  11/21/2020 2137 by Rocky Link, RN  Outcome: Progressing  Flowsheets (Taken 11/21/2020 2038 by Faylene Million, RN)  VH High Risk (Greater than 13):   ALL REQUIRED LOW INTERVENTIONS   ALL REQUIRED MODERATE INTERVENTIONS   BED ALARM WILL BE ACTIVATED WHEN THE PATEINT IS IN BED WITH SIGNAGE "RESET BED ALARM"  11/21/2020 2136 by Rocky Link, RN  Outcome: Progressing     Problem: Pain interferes with ability to perform ADL  Goal: Pain at adequate level as identified by patient  Outcome: Progressing  Flowsheets (Taken 11/21/2020 2137)  Pain at adequate level as identified by patient:   Identify patient comfort function goal   Assess for risk of opioid induced respiratory depression, including snoring/sleep apnea. Alert healthcare team of risk factors identified.   Offer non-pharmacological pain management interventions     Problem: Side Effects from Pain Analgesia  Goal: Patient will experience minimal side effects of analgesic therapy  Outcome: Progressing  Flowsheets (Taken 11/21/2020 2137)  Patient will experience minimal side effects of analgesic therapy:   Monitor/assess patient's respiratory status (RR depth, effort, breath sounds)   Assess for changes in cognitive function     Problem: Compromised Skin Integrity (Peds)  Goal: Child's skin integrity is maintained or improved  Outcome: Progressing  Flowsheets (Taken 11/21/2020 2137)  Child's skin integrity is maintained or improved:   Reposition patient every 2 hours and as needed, with hands-on care (NICU)   Assess and monitor skin integrity   Perform active/passive ROM  Mobility/Activity: 3  Sensory Perception: 1  Moisture: 4  Friction/Shear: 4  Nutrition: 4  Tissue perfusion and Oxygenation: 3  Starkid Skin Score: 19     Problem: Nutrition: Integumentary (Peds)  Goal: Child's Nutritional intake adequate for growth or improving  Outcome: Progressing  Flowsheets (Taken  11/21/2020 2137)  Child's nutritional intake is adequate for growth or improving:   Monitor and compare daily weight, monitor intake and output   Include patient/family in decisions related to nutrition/dietary selections

## 2020-11-21 NOTE — H&P (Signed)
Medicine History & Physical   [facility]   Patient Name: Carla Cannon, Carla Cannon LOS: 0 days   Attending Physician: Manning Charity, MD;Wi* PCP: Gershon Crane, FNP      Assessment and Plan:                                                              Carla Cannon is a 87 year old female with a past medical history of hypertension and restless leg syndrome who presents to the emergency department with her niece due to extreme age-related physical debility.    Extreme physical debility, age-related  Brought to emergency department by niece.  Niece visits from [place] twice monthly.  Over the last few visits the niece is noted severe debility, stating that her aunt is unable to get out of bed by herself.  Today while visiting her aunt she attempted to help the patient to the restroom and fell to the floor with patient.  Fall was controlled and neither suffered injury.  Unable to get her and off the floor, niece called EMS.  She states she is unable to take care of her anymore.  Of note patient had a recent fall 11/17/2020.  She was worked up in the ED without evidence of acute findings and discharged back home.  On interview, patient is pleasantly demented.  Exam was not done as patient verbally refused permission to examine several times.  Today work-up was without acute contributory findings.  Admit to inpatient for severe age-related debility   Plan for placement versus hospice  Consult social work in the a.m.    Hypertension  Some elevated pressures in the ED  Previously on lisinoprilhydrochlorothiazide combination  No longer taking medication    Restless leg  Previously on ropinirole  No longer taking medication    Dementia, age-related  Oriented to self and place only      DVT PPx: Lovenox   Dispo: Inpatient  Healthcare Proxy: Nephew   Code: do not resuscitate     History of Presenting Illness                                CC:  Debility  Female Carla Cannon is a 87 y.o. female patient who presents with niece after ground-level fall at home.  Niece visits twice monthly from [place] and states that over the last several visits she had noted severe debility and her aunt.  She says that her aunt has had several falls in the past and was recently in the emergency department 11/17/2020 after a fall.  She says that her aunt is unable to get out of bed without assistance and has no one at home to care for her.  Today while attempting to help her to that toilet niece and aunt suffered a ground-level controlled fall to the ground prompting her niece to call EMS.  Of note the patient is pleasantly demented, oriented only to self and place  Past Medical History:   Diagnosis Date    Anemia     Arthritis     Arthritis     Hypertension     Osteoarthritis     Osteopenia  Prediabetes     Pulmonary HTN     RLS (restless legs syndrome)      Past Surgical History:   Procedure Laterality Date    CATARACT EXTRACTION      ELBOW SURGERY      HYSTERECTOMY      WRIST FRACTURE SURGERY Left 10.4.2006       Family History   Problem Relation Age of Onset    Arthritis Mother      Social History     Tobacco Use    Smoking status: Never Smoker    Smokeless tobacco: Never Used   Vaping Use    Vaping Use: Never used   Substance Use Topics    Alcohol use: No    Drug use: No       Subjective   Review of Systems:  Unable to obtain due to dementia        Objective   Physical Exam:     Vitals: T:97.9 F (36.6 C) (Oral),  BP:175/79, HR:98, RR:16, SaO2:99%    Patient refused exam x3      Patient Vitals for the past 12 hrs:   BP Temp Pulse Resp   11/21/20 1518 175/79      11/21/20 1508 168/80 97.9 F (36.6 C) 98 16   11/21/20 1500 168/80 98.2 F (36.8 C) 94 16     Weight Monitoring 11/29/2014 09/09/2015 10/18/2019 10/16/2020 11/17/2020 11/21/2020 11/21/2020   Height 157.5 cm - 157.5 cm - 154.9 cm 154.9 cm 162.6 cm   Height Method Stated - Stated - Stated Stated  Stated   Weight 55.792 kg 60.782 kg 60.8 kg 73.029 kg 73.936 kg 75.751 kg 75.932 kg   Weight Method Stated Stated Stated - Standing Scale Bed Scale Bed Scale   BMI (calculated) 22.5 kg/m2 - 24.6 kg/m2 - 30.9 kg/m2 31.6 kg/m2 28.8 kg/m2          Recent Results (from the past 24 hour(s))   Comprehensive metabolic panel    Collection Time: 11/21/20  3:26 PM   Result Value Ref Range    Sodium 141 136 - 147 mMol/L    Potassium 3.8 3.5 - 5.3 mMol/L    Chloride 107 98 - 110 mMol/L    CO2 26 20 - 30 mMol/L    Calcium 8.8 8.5 - 10.5 mg/dL    Glucose 244 (H) 71 - 99 mg/dL    Creatinine 0.10 2.72 - 1.20 mg/dL    BUN 17 7 - 22 mg/dL    Protein, Total 6.7 6.0 - 8.3 gm/dL    Albumin 3.0 (L) 3.5 - 5.0 gm/dL    Alkaline Phosphatase 121 40 - 145 U/L    ALT 10 0 - 55 U/L    AST (SGOT) 18 10 - 42 U/L    Bilirubin, Total 1.3 (H) 0.1 - 1.2 mg/dL    Albumin/Globulin Ratio 0.81 0.80 - 2.00 Ratio    Anion Gap 11.8 7.0 - 18.0 mMol/L    BUN / Creatinine Ratio 23.9 10.0 - 30.0 Ratio    EGFR 71 60 - 150 mL/min/1.12m2    Osmolality Calculated 287 275 - 300 mOsm/kg    Globulin 3.7 2.0 - 4.0 gm/dL   CBC and differential    Collection Time: 11/21/20  3:26 PM   Result Value Ref Range    WBC 8.5 4.0 - 11.0 K/cmm    RBC 4.38 3.80 - 5.00 M/cmm    Hemoglobin 12.8 12.0 - 16.0 gm/dL    Hematocrit 40.1  36.0 - 48.0 %    MCV 92 80 - 100 fL    MCH 29 28 - 35 pg    MCHC 32 32 - 36 gm/dL    RDW 16.1 (H) 09.6 - 14.0 %    PLT CT 253 130 - 440 K/cmm    MPV 9.8 6.0 - 10.0 fL    Neutrophils % 77.1 42.0 - 78.0 %    Lymphocytes 14.0 (L) 15.0 - 46.0 %    Monocytes 7.2 3.0 - 15.0 %    Eosinophils % 1.5 0.0 - 7.0 %    Basophils % 0.2 0.0 - 3.0 %    Neutrophils Absolute 6.5 1.7 - 8.6 K/cmm    Lymphocytes Absolute 1.2 0.6 - 5.1 K/cmm    Monocytes Absolute 0.6 0.1 - 1.7 K/cmm    Eosinophils Absolute 0.1 0.0 - 0.8 K/cmm    Basophils Absolute 0.0 0.0 - 0.3 K/cmm   Troponin I    Collection Time: 11/21/20  3:26 PM   Result Value Ref Range    Troponin I 0.02 0.00 - 0.02 ng/mL    Magnesium    Collection Time: 11/21/20  3:26 PM   Result Value Ref Range    Magnesium 2.0 1.6 - 2.6 mg/dL   Urinalysis with Microscopic if Indicated    Collection Time: 11/21/20  3:26 PM   Result Value Ref Range    Color, UA Yellow Colorless,Yellow,Light-Yellow    Clarity, UA Clear Clear    Urine Specific Gravity 1.022     pH, Urine 6.5 5.0 - 8.0 pH    Protein, UR 20 (A) Negative mg/dL    Glucose, UA Negative Negative mg/dL    Ketones UA Negative Negative mg/dL    Bilirubin, UA Negative Negative    Blood, UA 0.2 (A) Negative    Nitrite, UA Negative Negative    Urobilinogen, UA 6.0 (A) Normal mg/dL    Leukocyte Esterase, UA Negative Negative Leu/uL    UR Micro Performed     WBC, UA 0 0 - 4 /hpf    RBC, UA 6 (H) 0 - 5 /hpf    Squam Epithel, UA 3 (H) 0 - 2 /lpf   Creatine Kinase (CK)    Collection Time: 11/21/20  3:26 PM   Result Value Ref Range    Creatine Kinase (CK) 125 30 - 189 U/L   ECG 12 lead    Collection Time: 11/21/20  3:32 PM   Result Value Ref Range    Patient Age 43 years    Patient DOB Nov 25, 1919     Patient Height      Patient Weight      Interpretation Text       Sinus rhythm  Short PR interval  Biatrial enlargement  Compared to ECG 08/23/2013 11:14:54  Short PR interval now present  Atrial abnormality now present      Physician Interpreter      Ventricular Rate 91 //min    QRS Duration 89 ms    P-R Interval 54 ms    Q-T Interval 379 ms    Q-T Interval(Corrected) 467 ms    P Wave Axis 51 deg    QRS Axis 2 deg    T Axis 48 years        Allergies   Allergen Reactions    Sulfamethoxazole-Trimethoprim      patient fell after taking this med and felt wealk    Cephalosporins Photosensitivity    Ciprocin-Fluocin-Procin [Fluocinolone] Photosensitivity  Ciprofloxacin     Codeine Nausea Only    Other      Nausea    Penicillins Nausea Only    Sertraline      Nausea    Varicella Virus Vaccine Live      rash    Ketoconazole Rash      XR Ankle Left 3+ Views    Result Date: 11/17/2020  IMPRESSION: No acute  bony abnormality in the ankle. ReadingStation:[id number]    XR Foot Left AP Lateral And Oblique    Result Date: 11/17/2020  1.  No acute fracture or traumatic malalignment. 2.  Moderate first MTP, interphalangeal and mild midfoot degenerative change. 3.  Osteopenia, which obscures fine bony detail. 4.  Fixed flexion at the second through fifth toes. 5.  Calcaneal enthesophytes. 6.  Arterial calcifications. ReadingStation:[email or web address]     Home Medications     Med List Status: Complete Set By: Forrest Moron, RN at 11/21/2020  3:16 PM                aspirin EC 81 MG EC tablet     Take 81 mg by mouth daily.     gabapentin (NEURONTIN) 100 MG capsule     Take 100 mg by mouth daily.        lisinopril-hydrochlorothiazide (PRINZIDE,ZESTORETIC) 20-12.5 MG per tablet     Take 1 tablet by mouth daily.        nystatin (NYSTOP) 100000 UNIT/GM Powder     1 application daily.        pramipexole (MIRAPEX) 0.5 MG tablet     TAKE 1 TABLET BY MOUTH EVERY DAY AT BEDTIME AS NEEDED FOR RESTLESS LEGS     rOPINIRole (REQUIP) 0.5 MG tablet     Take 1 mg by mouth nightly.        triamcinolone (KENALOG) 0.1 % cream     Apply topically 2 (two) times daily Apply small amount to affected area bid for up to 2 weeks         Meds given in the ED:  Medications - No data to display   Time Spent: 45 minutes     Johna Roles, DO     11/21/20,6:48 PM   MRN: [id number]                                      CSN: [id number] DOB: [birth year]

## 2020-11-21 NOTE — Telephone Encounter (Signed)
Pt being seen today

## 2020-11-22 LAB — COMPREHENSIVE METABOLIC PANEL
ALT: 12 U/L (ref 0–55)
AST (SGOT): 17 U/L (ref 10–42)
Albumin/Globulin Ratio: 0.88 Ratio (ref 0.80–2.00)
Albumin: 2.8 gm/dL — ABNORMAL LOW (ref 3.5–5.0)
Alkaline Phosphatase: 97 U/L (ref 40–145)
Anion Gap: 11.5 mMol/L (ref 7.0–18.0)
BUN / Creatinine Ratio: 17.5 Ratio (ref 10.0–30.0)
BUN: 11 mg/dL (ref 7–22)
Bilirubin, Total: 1.7 mg/dL — ABNORMAL HIGH (ref 0.1–1.2)
CO2: 24 mMol/L (ref 20–30)
Calcium: 8.4 mg/dL — ABNORMAL LOW (ref 8.5–10.5)
Chloride: 107 mMol/L (ref 98–110)
Creatinine: 0.63 mg/dL (ref 0.60–1.20)
EGFR: 75 mL/min/{1.73_m2} (ref 60–150)
Globulin: 3.2 gm/dL (ref 2.0–4.0)
Glucose: 110 mg/dL — ABNORMAL HIGH (ref 71–99)
Osmolality Calculated: 278 mOsm/kg (ref 275–300)
Potassium: 3.5 mMol/L (ref 3.5–5.3)
Protein, Total: 6 gm/dL (ref 6.0–8.3)
Sodium: 139 mMol/L (ref 136–147)

## 2020-11-22 LAB — ECG 12-LEAD
P Wave Axis: 51 deg
P-R Interval: 54 ms
Patient Age: 89 years
Q-T Interval(Corrected): 467 ms
Q-T Interval: 379 ms
QRS Axis: 2 deg
QRS Duration: 89 ms
T Axis: 48 years
Ventricular Rate: 91 //min

## 2020-11-22 LAB — CBC AND DIFFERENTIAL
Basophils %: 0.4 % (ref 0.0–3.0)
Basophils Absolute: 0 10*3/uL (ref 0.0–0.3)
Eosinophils %: 2.2 % (ref 0.0–7.0)
Eosinophils Absolute: 0.1 10*3/uL (ref 0.0–0.8)
Hematocrit: 39.2 % (ref 36.0–48.0)
Hemoglobin: 12.1 gm/dL (ref 12.0–16.0)
Lymphocytes Absolute: 1.3 10*3/uL (ref 0.6–5.1)
Lymphocytes: 20.6 % (ref 15.0–46.0)
MCH: 28 pg (ref 28–35)
MCHC: 31 gm/dL — ABNORMAL LOW (ref 32–36)
MCV: 91 fL (ref 80–100)
MPV: 9.9 fL (ref 6.0–10.0)
Monocytes Absolute: 0.5 10*3/uL (ref 0.1–1.7)
Monocytes: 8.2 % (ref 3.0–15.0)
Neutrophils %: 68.5 % (ref 42.0–78.0)
Neutrophils Absolute: 4.3 10*3/uL (ref 1.7–8.6)
PLT CT: 247 10*3/uL (ref 130–440)
RBC: 4.31 10*6/uL (ref 3.80–5.00)
RDW: 15.1 % — ABNORMAL HIGH (ref 11.0–14.0)
WBC: 6.2 10*3/uL (ref 4.0–11.0)

## 2020-11-22 MED ORDER — NYSTATIN 100000 UNIT/GM EX POWD
Freq: Two times a day (BID) | CUTANEOUS | Status: DC
Start: 2020-11-22 — End: 2020-11-26
  Filled 2020-11-22 (×2): qty 15

## 2020-11-22 NOTE — Plan of Care (Signed)
Problem: Moderate/High Fall Risk Score >5  Goal: Patient will remain free of falls  Outcome: Progressing  Flowsheets (Taken 11/22/2020 1000)  VH High Risk (Greater than 13):   ALL REQUIRED LOW INTERVENTIONS   ALL REQUIRED MODERATE INTERVENTIONS   RED "HIGH FALL RISK" SIGNAGE   BED ALARM WILL BE ACTIVATED WHEN THE PATEINT IS IN BED WITH SIGNAGE "RESET BED ALARM"   A CHAIR PAD ALARM WILL BE USED WHEN PATIENT IS UP SITTING IN A CHAIR   PATIENT IS TO BE SUPERVISED FOR ALL TOILETING ACTIVITIES     Problem: Pain interferes with ability to perform ADL  Goal: Pain at adequate level as identified by patient  Outcome: Progressing  Flowsheets (Taken 11/22/2020 1030)  Pain at adequate level as identified by patient:   Identify patient comfort function goal   Assess for risk of opioid induced respiratory depression, including snoring/sleep apnea. Alert healthcare team of risk factors identified.   Assess pain on admission, during daily assessment and/or before any "as needed" intervention(s)   Reassess pain within 30-60 minutes of any procedure/intervention, per Pain Assessment, Intervention, Reassessment (AIR) Cycle   Evaluate if patient comfort function goal is met   Evaluate patient's satisfaction with pain management progress  Note: Patient reports no pain     Problem: Compromised Tissue integrity  Goal: Damaged tissue is healing and protected  Outcome: Progressing  Flowsheets (Taken 11/22/2020 1030)  Damaged tissue is healing and protected:   Monitor/assess Braden scale every shift   Provide wound care per wound care algorithm   Increase activity as tolerated/progressive mobility   Relieve pressure to bony prominences for patients at moderate and high risk   Avoid shearing injuries   Keep intact skin clean and dry   Encourage use of lotion/moisturizer on skin   Monitor patient's hygiene practices

## 2020-11-22 NOTE — OT Eval Note (Signed)
[facility]  [address] [place] [address]    Department of Rehabilitation  [id number]    Occupational Therapy Evaluation    Carla Cannon    CSN#: [id number]  [facility] ACUTE CARE 308/308-A    Consult received for Carla Cannon for OT Evaluation and Treatment.  Patients medical condition is appropriate for Occupational therapy intervention at this time.    Time of treatment:   Time Calculation  OT Received On: 11/22/20  Start Time: 0853  Stop Time: 0922  Time Calculation (min): 29 min  Total Treatment Time (min): 29    Visit#: 1    Precautions and Contraindications:   Aspiration precautions  Falls  Diet: Minced and moist, thin liquids    OT Assessment/Clinical Decision Making:      Carla Cannon is a 87 y.o. female admitted 11/21/2020 with age-related physical debility. Pt has had several recent falls resulting in left ankle injury (imaging revealed no fracture when arriving in the ED). Co-morbidities impacting tx include HTN, restless leg syndrome, age-related dementia, OA, cataract extraction, and previous left wrist fracture. Prior level of function and social history was confirmed and provided by pt's niece, Debarah Crape. Prior to admission Pt living alone where she was able to complete all ADLs independently. Majority of IADLs were completed by family members including providing dinners, groceries and managing her finanaces. Pt currently is max A for sit to/from stand using FWW, min-mod A for stand-pivot transfer using FWW and is max A for all LB ADLs at this time. At this time Pt is not safe to return home alone and is most appropriate for SNF upon discharge.    Patient presenting with the following OT impairments:  decreased strength, balance deficits, edema, decreased independence with ADLs, decreased independence with IADLs, decreased activity tolerance, decrease safety awareness, decreased cognition, decreased problem solving,  decreased functional mobility, decreased functional transfers    Patient will benefit from skilled OT services to maximize safety and independence with ADLs, functional mobility/transfers, and use of compensatory strategies, DME prn    Upon review of the occupational profile and client history, the patients history was found to be BRIEF.  During assessment of the occupational performance, the patient demonstrated total of 5 or more performance deficits with minimal to moderate modificationsof task or assistance during assessment.  The patient has several treatment options in order to improve patients level of function.  After review of the above, patients degree of complexity is MODERATE.    Rehabilitation Potential: Good With continued OT s/p acute discharge 24 hour supervision recommended     Discussed risks and benefits and Plan of Care with: Patient    Plan:   Treatment/interventions: ADL retraining, functional transfer training, UE strengthening/ROM, activity pacing, cognition (safety awareness, problem solving, etc.), patient/family training, equipment eval/education, compensatory technique education    Treatment Frequency: OT Frequency Recommended: 4-5x/wk    Goals: (timeframe: by d/c)  1. Pt will participate in clothing management up/down hips with no more than mod A using AE/AT prn to max independence and safety during LB dressing.  2. Pt will participate in toileting with no more than mod A using AE/AT prn to max independence with toileting tasks.  3. Pt will participate in functional transfer to chair/BSC/toilet with no more than min Ax1 using AD/AT prn to promote independence with out of bed activities.  4. Pt will participate in standing for 1 minute with no more than min  A using AE/AT prn to max activity tolerance in prep for LB tasks.     DISCHARGE RECOMMENDATIONS   DME recommended for Discharge:   TBD at next level of care    Discharge Recommendations:   SNF        Medical & Therapy History:    Medical Diagnosis: Age-related physical debility [R54]  Frequent falls [R29.6]  Contusion of left foot, initial encounter [S90.32XA]    Carla Cannon is a 87 y.o. female admitted on 11/21/2020 with age related physical disability. Pt has had several falls in the last few months, one of which resulted on left ankle injury.    X-Rays/Tests/Labs:  XR Ankle Left 3+ Views    Result Date: 11/17/2020  IMPRESSION: No acute bony abnormality in the ankle. ReadingStation:[id number]    XR Foot Left AP Lateral And Oblique    Result Date: 11/17/2020  1.  No acute fracture or traumatic malalignment. 2.  Moderate first MTP, interphalangeal and mild midfoot degenerative change. 3.  Osteopenia, which obscures fine bony detail. 4.  Fixed flexion at the second through fifth toes. 5.  Calcaneal enthesophytes. 6.  Arterial calcifications. ReadingStation:[email or web address]      Discussed with patient/family/caregiver the patient's physical, cognitive and/or psychosocial history related to current functional performance: yes    Previous therapy services: None noted in chart    Patient Active Problem List   Diagnosis    Shoulder joint pain    Impingement syndrome of shoulder region, unspecified laterality    Benign essential hypertension    Bilateral edema of lower extremity    Debility    History of meningitis    IDA (iron deficiency anemia)    Idiopathic urticaria    Impaired gait    Osteoarthritis    Osteopenia    Prediabetes    Pulmonary hypertension, unspecified    Restless leg    Cat bite, initial encounter    Age-related physical debility    Frequent falls    History of recent fall    Contusion of left foot        Past Medical/Surgical History:  Past Medical History:   Diagnosis Date    Age-related physical debility     11/21/20 admission    Anemia     Arthritis     Arthritis     Hypertension     Osteoarthritis     Osteopenia     Prediabetes     Pulmonary HTN     RLS (restless legs syndrome)       Past Surgical  History:   Procedure Laterality Date    CATARACT EXTRACTION      ELBOW SURGERY      HYSTERECTOMY      WRIST FRACTURE SURGERY Left 10.4.2006         Occupational Profile:   Home Living Arrangements:  Social history provided by patient and confirmed by niece, Debarah Crape as pt is not the most accurate historian  Living Arrangements: Alone  Assistance Available: prn assist from friends and neighbors who bring her dinners daily. Pt has assist from niece Debarah Crape once every other week for groceries and finances (Claudia lives in [place])  Type of Home: House  Home Layout: One level, Ramped entrance  Bathroom:  Standard toilet, tub/shower with shower chair  DME Currently at Home: lift chair, life alert (per [facility] she does not use), FWW, w/c, shower chair  Home O2 use: None     Prior Level of Function:  Mobility: Mod I  with FWW within her home, per niece pt completes limited mobiltiy at home  Fall History: At least 4 falls in the last several months per niece, pt denies any recent falls     Activities of Daily Living:   Independent with ADLs    Instrumental Activities of Daily Living:  Meal preparation & cleanup: Light meal prep and pt will make coffee in the mornings and complete microwave meals for breakfast and has an apple for lunch  Financial management: Niece completes  Driving & community mobility: Driving: Does not drive  Care of pets: Pt has a cat that she cares for    Work:  Retired    Leisure:  Enjoys Pharmacist, hospital, specifically [employer] on the [employer] goal for OT: to return to her Passenger transport manager concerns & priorities: None reported    Subjective:   "No I havent fallen. My girlfriend's boyfriend was dancing with the cat and stepped on me."  Patient is agreeable to participation in the therapy session.     Pain:  At Rest: 0 /10  With Activity: 6/10  Location: Ankle:  left  Interventions: Repositioned, PT applied ice  Pain Scale Used: FLACC, pt unable to give pain number. Pt  reported "a lot of pain" with standing and weight bearing    ASSESSMENT OF OCCUPATIONAL PERFORMANCE:   Observation of Patient:    Patient is in bed with telemetry, female external catheter in place.     Edema: Moderate edema noted on dorsal aspect of left foot and toes, mild edema present to left ankle  Skin Inspection: Bruising and redness along left foot (dorsal aspect) otherwise appeared intact through observable areas    Vital Signs:   BP Supine: 144/88 mmHg  BP after activity: 163/95 mmHg, RN aware  HR Supine: 92 bpm  HR after activity: 107 bpm  SpO2 at rest: 97% on room air  SpO2 with activity: 97% on room air     Oriented to: Person, Place , Situation and Disoriented to time reporting it was [employer] following: Follows multi-step commands with repetition, due to being hard of hearing  Alertness/Arousal: Appropriate responses to stimuli   Attention Span:Appears intact  Memory: Decreased recall of precautions, Decreased recall of recent events  Safety Awareness: moderate verbal instruction  Insights: Decreased awareness of deficits  Problem Solving: Assistance required to identify errors made, Assistance required to generate solutions, Assistance required to implement solutions  Behavior: Cooperative  Coordination: Within Normal Limits (WNL)  Hand dominance: Right handed    Musculoskeletal Examination:   Range of motion:  Bilateral UE: Grossly WFL       Strength:  Right Shoulder Flexion:  4-/5  Right Shoulder Extension:  4-/5  Right Elbow Flexion:  3+/5  Right Elbow Extension:  3+/5  Left Shoulder Flexion:  3+/5  Left Shoulder Extension:  4-/5  Left Elbow Flexion:  3+/5  Left Elbow Extension:  3+/5   Crepitus noted during MMT on bilateral shoulders  Grip strength equal and adequate bilaterally         Sensory/Oculomotor Examination:   Auditory:  impaired:  bilaterally  Tactile-Light Touch:  WFL=intact  Visual Acuity:  WFL=intact. Able to read name badge without difficulty.         Activities of Daily  Living:   Eating:  Set up assistance based on FM coordination seated in chair  Grooming: Set up based on BUE AROM screen seated  UB dressing:  Appears  set up based on BUE AROM screen seated   LB dressing: Max A to don socks seated edge of bed, simulated max A for standing clothing management up   Bathing: Mod-max A seated based on BUE AROM screen and LB dressing tasks  Toileting: Likely max A for LB dressing and standing balance        Functional Mobility:   Bed Mobility:  Supine to Sit:   CGA.   HOB elevated, assist at trunk  Seated Scooting:   CGA    Transfers:  Sit to Stand:  Maximal assist with Front wheeled walker, Gait belt.   Cues for Sequencing  Stand to Sit:  Minimal assist.   Cues for Sequencing  Stand Pivot:   Minimal assist Moderate assist with Front wheeled walker, Gait belt.   Cues for Sequencing, Cues for Hand Placement    Balance:   SBA for seated balance, min-mod A for standing balance with need for BUE support from [facility]    Participation and Activity Tolerance   Participation effort: Good  Activity Tolerance: Tolerates 30 minutes of activity with rest breaks    Other Treatment Interventions:   Treatment Activities:   Not applicable          Education Provided:   Topics: Role of occupational therapy, plan of care, goals of therapy and safety with mobility and ADLs, benefits of activity.    Individuals educated: Patient.  Method: Explanation.  Response to education: Verbalized understanding.    Team Communication:   OT communicated with: RN/LPN - Amber, PT - Verne Grain, SLP - Sarah  OT communicated regarding: Pre-session re: patient status, Patient position at end of session, Patient participation with Therapy, Vital signs  OT/COTA communication: via written note and verbal communication as needed.    Sitting, in a chair, Needs in reach, Bed/chair alarm set, No distress and on room air    Recommend client sit OOB for all meals outside of and in addition to OT session.    Raleigh Lions, OT,  OTR/L

## 2020-11-22 NOTE — Progress Notes (Signed)
Readmission Risk  [facility] ACUTE CARE   Patient Name: Carla Cannon   Attending Physician: Judi Cong, DO   Today's date:   11/22/2020 LOS: 1 days   Expected Discharge Date      Readmission Assessment:                                                              Discharge Planning  ReAdmit Risk Score: 12  Care Coordination with Palliative Care: Not Available  Does the patient have perscription coverage?: Yes  Utilize [place] [facility] Program: No  Confirmed PCP with Pt: Yes  Confirmed PCP name: [place]  Confirm Transport to F/U Appt.: Self/Private Vehicle/Friend  Anticipated Home Health at [place]: No  Anticipated Placement at [place]: Yes  CM Comments: 2/11 - SW - Debility - Falls - Ankle Injury - Unsafe to discharge home - Plan to seek LTC SNF placement - Awaiting capacity determination - [place] referral for Medicaid - See detailed note    SW spoke with Resident MD Dr. Warnell Bureau who reported patient cannot make decisions - SW spoke with family Kenneshia Rehm nephew and his wife Burton Apley - They report patient resides alone - She ambulates with FWW and is independent for ADL's - Claudia manages IADL's - Claudia visits patient 2 x per month - They reported that patient had a therapist that visisted 2 x per week and they tried to employ a private caregiver for 4 hours per week but patient would not let them in the home - The family lives in [place] -     [place] reported that patient can be very difficult to work with and often refuses assistance for her needs - He states that she has a son from which she is estranged that lives in [place] and that he is the [employer] as a result of that estrangement    The family reported that normally patient is alert and oriented and able to answer all questions appropriately - SW spoke with attending MD and asked him to make determination regarding capacity to make medical decisions as the family is requesting Long Term Care  nursing home placement    SW has made referral to [facility] for LTC Medicaid screening    SW will meet with patient to discuss placement if MD determines she can make decisions regarding her care     IDPA:   Patient Type  Within 30 Days of Previous Admission?: No  Healthcare Decisions  Interviewed:: Family  Name of interviewee if other than the pt:: Joellyn Rued and [employer]  Orientation/Decision Making Abilities of Patient: Demented patient  Advance Directive: Patient has advance directive, copy in chart  Healthcare Agent Appointed: No  Prior to admission  Prior level of function: Independent with ADLs,Ambulates with assistive device  Type of Residence: Private residence  Home Layout: Two level,Able to live on main level with bedroom/bathroom,Ramped entrance  Have running water, electricity, heat, etc?: Yes  Living Arrangements: Alone  How do you get to your MD appointments?: Neice  How do you get your groceries?: Neice  Who fixes your meals?: Self/Friends/Neighbors  Who does your laundry?: Neice  Who picks up your prescriptions?: No scripts  Dressing: Needs assistance  Grooming: Needs assistance  Feeding: Independent  Bathing: Needs assistance  Toileting: Needs assistance  DME Currently at Home: Dan Humphreys, [employer], broadcasting/film/video  Support Systems: Family members  Patient expects to be discharged to:: LTC  Anticipated [place] plan discussed with:: Same as interviewed  Mode of transportation:: Other  Does the patient have perscription coverage?: Yes  Consults/Providers  PT Evaluation Needed: Yes (Comment)  OT Evalulation Needed: Yes (Comment)  SLP Evaluation Needed: Yes (Comment)  Correct PCP listed in Epic?: Yes  Family and PCP  PCP on file was verified as the current PCP?: Yes   30 Day Readmission:       Provider Notifications:            Clois Comber. Jaheem Hedgepath, BSW  Social Worker  [facility]

## 2020-11-22 NOTE — SLP Eval Note (Signed)
[facility]  Speech Language Pathology   Bedside Swallow Evaluation    Patient: Carla Cannon     CSN#: [id number]   [facility] ACUTE CARE 308/308-A    Referring Physician: Marena Chancy Date of Referral: 11/22/20    SLP reason:  Consult received for SLP Bedside Swallow Evaluation and Treatment for concerns for dysphagia.  Patient is a 87 y/o female who presented to ED due to extreme age related physical disability. PMH is significant for RLS, pulmonary HTN, prediabetes, osteopenia, osteroarthris, hypertension, arthritis, anemia, age-related physical debility.     ASSESSMENT/PLAN/RECOMMENDATIONS:     SLP diagnosis:  mild Oropharyngeal Dysphagia characterized by generalized weakness, inability to fully masticate soft solids, likely due to age related changes of the swallow.     Diet Recommendations:  Minced and Moist diet/Thin liquids   Additional restrictions:  n/a per pt diagnosis/medical hx and per previously ordered diet.    Administration of Medications: Meds whole in applesauce/pureed    Precautions/Compensations:  Position patient upright 90 degrees for all po intake/meds, Keep patient upright 45 minutes after meals, Take small bites/sips, Eat/feed slowly, Aspiration precautions, Reflux precautions  Suggestions for Feeding:  1:1 supervision (next to patient to supervise each bite/sip, provide hands-on assist/cues prn, 100% of meal visualized), assist with meal set up.  Prognosis:  Good  Aspiration Risk: cough after the swallow, hx pulmonary disease, age >43  Instrumental Swallow Study recommended: SLP to continue to monitor need;  SLP Follow-Up Inpatient Treatment needs: 3-5x/wk (M-F)  Duration of Inpatient Treatment: Until swallowing managed  Projected Discharge Recommendations:  Patient will need ongoing SLP at next level of care, SNF  Other referrals:  n/a    Goals:  Pt will:  STG:  (In 3-4 days)  1. Pt will tolerate a minced and moist diet with thin liquids free of  signs/symptoms of aspiration. (NEW)  2. Pt/staff education (NEW)  3. Pt will use compensatory swallow strategies of Take small bites/sips, Eat/feed slowly during meals given mod assist (NEW)    LTG: (By discharge)  1. Pt will tolerate the least restrictive diet free of signs/symptoms of aspiration.  (NEW)      HISTORY OF PRESENT ILLNESS:       Medical Diagnosis: Age-related physical debility [R54]  Frequent falls [R29.6]  Contusion of left foot, initial encounter [S90.32XA]    History of Present Illness: [name] is a 87 y.o. female admitted on 11/21/2020 with   Patient Active Problem List   Diagnosis    Shoulder joint pain    Impingement syndrome of shoulder region, unspecified laterality    Benign essential hypertension    Bilateral edema of lower extremity    Debility    History of meningitis    IDA (iron deficiency anemia)    Idiopathic urticaria    Impaired gait    Osteoarthritis    Osteopenia    Prediabetes    Pulmonary hypertension, unspecified    Restless leg    Cat bite, initial encounter    Age-related physical debility    Frequent falls    History of recent fall    Contusion of left foot        Past Medical/Surgical History:  Past Medical History:   Diagnosis Date    Age-related physical debility     11/21/20 admission    Anemia     Arthritis     Arthritis     Hypertension     Osteoarthritis  Osteopenia     Prediabetes     Pulmonary HTN     RLS (restless legs syndrome)       Past Surgical History:   Procedure Laterality Date    CATARACT EXTRACTION      ELBOW SURGERY      HYSTERECTOMY      WRIST FRACTURE SURGERY Left 10.4.2006         Baseline cognitive-communication/swallowing status:  no hx of dysphagia    SUBJECTIVE:      Subjective: Patient is agreeable to participation in the therapy session. Nursing clears patient for therapy. Patients medical condition is appropriate for Speech therapy intervention at this time.    S: "I eat a honeybun and coffee every  morning and an apple and  Yogurt for lunch." Patient is oriented x 3 , alert but pleasantly confused. Patient stated she has dentures however they are not available here at the hospital.    OBJECTIVE:     Observation of Patient/Vital Signs:    Positioning: Patient is upright seated in a bedside chair.  Respiratory status: Room air  Source of nutrition at time of eval:  Regular diet, thin liquids  Other medical equipment in place: IV   O:    Oral Motor/Swallow Skills:    Dentition: Edentulous  Strength:  Impaired, Generalized weakness  Coordination:  WFL  ROM: WFL  Symmetry:  WFL   Oral Apraxia:  N/A  Sensation:  delayed cough/throat clear response  Gag Reflex:  Not assessed  Laryngeal Elev:  hyolaryngeal elevation is visualized  Cough/Grunt:  delayed post swallow  Motor Speech/Voice:  WFL    Consistencies presented:  THIN LIQUID: cup sip x 3 , straw sip x 8  3x delayed weak throat clear/cough. no change in respiratory status, no vocal change  MILDLY THICK (NECTAR) LIQUID: cup sip x 1 , straw sip x2  1x delayed weak throat clear/cough. no change in respiratory status, no vocal change. Performance is not improved with thicker viscosity.  PUREED: 1 tsp x 2   No signs/symptoms of aspiration, no change in respiratory status, no vocal change, timely initiation of the swallow reflex  SOFT AND BITE SIZED: 1 tsp x 2   Pt took large heaping bites even after clues to take smaller bites; pt unable to breakdown SAB due to generalized weakness, lack of dentition, and large size of bites. SLP had to retrieve remaining bolus from mouth ~ 2 minutes.     Pt/Family/Caregiver Education Provided:     Patient was/were educated re: role of slp, safe feeding strategies, results/recommendations of today's evaluation  Good understanding was verbalized/demonstrated: questionable  Comprehension limited by: impaired cognitive-communication skills    Team Communication:     Spoke to : RN/LPN - Amber/Andrea  Regarding: safe feeding strategies,  results/recommendations of today's evaluation  Good understanding was verbalized: yes      Time of Treatment:   SLP Received On: 11/22/20  Start Time: 1000  Stop Time: 1020  Time Calculation (min): 20 min           Evaluation was completed by:  Mervin Hack, MA, CC-SLP   Clinical II  [facility] SLP

## 2020-11-22 NOTE — PT Eval Note (Signed)
[facility]:  [facility]  Department of Rehabilitation  [address] [place], [place] [address]  [phone]      Carla Cannon    CSN: [id number]    [facility] ACUTE CARE   308/308-A    Physical Therapy Evaluation    Time of treatment:  Time Calculation  PT Received On: 11/22/20  Start Time: 3086  Stop Time: 0922  Time Calculation (min): 29 min    Visit#: 1                                                                                 Precautions and Contraindications:   Aspiration precautions  Falls  Mobility protocol     Clinical Presentation and Decision Making     PT Assessment:  Carla Cannon was admitted 11/21/2020 with extreme age-related physical debility. Pertinent PMH includes: frequent falls, L foot contusion, HTN, LE edema, debility, OA, osteopenia, restless leg, and anemia. Previous level of function: per her niece patient lives alone and a friend brings her dinner each night. Her niece visit every other week to help with shopping and bills. Patient spends most of the day in her lift chair but is able to ambulate short distances in her house with FWW independently. Patient was participating in home health twice a week. Current level of function: since her most recent fall patient has had increased difficulty ambulating and is now unable to get around her home on her own. In session today patient was able to tolerate getting up to bedside chair with max assist to stand and min/mod assist to pivot but was unable to tolerate any walking. Discharge recommendations at this time is SNF due to patient being below her normal baseline and not safe to live independently with current functional status.     Patient presenting with the following PT Impairments:decreased ROM, decreased strength, decreased safety/judgement during functional mobility, decreased activity tolerance, decreased functional mobility, decreased balance, gait deficits,  pain    Patient will benefit from skilled PT services in order to improve overall functional mobility for return to independence.      Due to the presence of several treatment options and several comorbidities or personal factors that affect performance, as well as patient's stable and/or uncomplicated characteristics, minimal to moderate modifications of mobility and/or assistance were necessary to complete evaluation when examining total of 3 elements (includes body structures and functions, activity limitations and/or participation restriction) determines the degree of complexity for this patient is MODERATE    Rehabilitation Potential:Good    Discussed risk, benefits and Plan of Care with: Patient    DISCHARGE RECOMMENDATIONS   DME recommended for Discharge:   Has needed equipment    Discharge Recommendations:   SNF         Development of Plan of Care:     Goals:    By the time of discharge:  -Patient will be able to perform supine<>sit with SBA assist for increased independence with bed mobility NEW GOAL  -Patient will be able to perform stand pivot transfer with SBA assist and FWW to improve safe functional mobility NEW GOAL  -Patient will  be able to ambulate 50 feet with FWW assist and contact guard for improved household mobility NEW GOAL  -Patient will be able to participate in at least 5 seated LE exercises to improve LE strength and tolerance to activities. NEW GOAL   Treatment/interventions: Exercise, Gait training, Stair training, Neuromuscular re-education, Functional transfer training, LE strengthening/ROM, Cognitive reorientation, Patient/caregiver training, Equipment eval/education, Bed mobility, Compensatory technique education, ice pack application    Treatment Frequency: 3-4x/wk    History:   History of Present Illness:    Medical Diagnosis: Age-related physical debility [R54]  Frequent falls [R29.6]  Contusion of left foot, initial encounter [S90.32XA]    Carla Cannon is a 87 y.o. female  admitted on 11/21/2020 with extreme age-related physical debility    Patient Active Problem List   Diagnosis    Shoulder joint pain    Impingement syndrome of shoulder region, unspecified laterality    Benign essential hypertension    Bilateral edema of lower extremity    Debility    History of meningitis    IDA (iron deficiency anemia)    Idiopathic urticaria    Impaired gait    Osteoarthritis    Osteopenia    Prediabetes    Pulmonary hypertension, unspecified    Restless leg    Cat bite, initial encounter    Age-related physical debility    Frequent falls    History of recent fall    Contusion of left foot        X-Rays/Tests/Labs:  XR Ankle Left 3+ Views    Result Date: 11/17/2020  IMPRESSION: No acute bony abnormality in the ankle. ReadingStation:[email or web address]    XR Foot Left AP Lateral And Oblique    Result Date: 11/17/2020  1.  No acute fracture or traumatic malalignment. 2.  Moderate first MTP, interphalangeal and mild midfoot degenerative change. 3.  Osteopenia, which obscures fine bony detail. 4.  Fixed flexion at the second through fifth toes. 5.  Calcaneal enthesophytes. 6.  Arterial calcifications. ReadingStation:[email or web address]        Past Medical/Surgical History:  Past Medical History:   Diagnosis Date    Age-related physical debility     11/21/20 admission    Anemia     Arthritis     Arthritis     Hypertension     Osteoarthritis     Osteopenia     Prediabetes     Pulmonary HTN     RLS (restless legs syndrome)       Past Surgical History:   Procedure Laterality Date    CATARACT EXTRACTION      ELBOW SURGERY      HYSTERECTOMY      WRIST FRACTURE SURGERY Left 10.4.2006         Social History:    Home Living Arrangements:  Living Arrangements: Alone  Assistance Available: Part time, a friend brings her food each night for dinner and her neice visits every other week to assit with groceries and bills.  Type of Home: House  Home Layout: One level, with no stairs to enter, Ramped  entrance, Bathroom details: standard toilet;  tub/shower unit    Prior Level of Function:  Household ambulation, neice reports that she mostly just gets up from lift chair to walk to kitchen and back to chair.  Fall history: patient reports no falls but after OT speaking with niece patient has had at least 4 falls in the last 6 months.     DME available at home:  Front wheeled  walker (Adult)  Shower chair  lift chair    Subjective   "I can't walk."     Patient is agreeable to participation in the therapy session.     Patient/caregiver goal for PT: "to just be happy and move around"    Pain:  Location: Ankle:  left  Interventions: Cold applied, Repositioned, Rest   Pain scale used: FLACC, pt reports "a lot of pain" and is not able to give a number. Patient does exhibit facial grimace and decreased ability to weight bear.   Pain following intervention: ice x 20 min, pt reports "that feels much better"    Examination of Body Systems (Structures, Function, Activity and Participation)   Patients medical condition is appropriate for Physical therapy intervention at this time    Observation of patient:  Patient is in bed with telemetry, continuous pulse oximeter, female external catheter in place.    Cognition:  Oriented to: Person, Place  and Situation  Command following: Follows multi-step commands with increased time  Alertness/Arousal: Appropriate responses to stimuli   Attention Span:Appears intact  Memory: Decreased recall of biographical information, Decreased recall of recent events  Safety Awareness: minimal verbal instruction  Insights: Decreased awareness of deficits  Problem Solving: Assistance required to identify errors made, Assistance required to generate solutions, Assistance required to implement solutions    Vital Signs (Cardiovascular):    BP Supine:  144/88 mmHg  BP after activity: 163/95 mmHg  HR Supine: 92 bpm  HR after activity: 107 bpm  SpO2 at rest: 97% on room air  SpO2 with activity: 97% on  room air    Edema: patient has edema over left met head and toes, due to significant pain did not check for pitting. No pitting at ankle or above.  Skin Inspection: significant bruising over left met head and toes.   Sensation: intact , to light touch, bilateral lower extremities    Balance:  Static Sitting:  Good  Dynamic Sitting:  Good  Static Standing:  Fair+  Dynamic Standing:  Fair   -Patient had difficulty with static and dynamic standing due to significant pain in left ankle with weight bearing.          Musculoskeletal Examination:     Range of motion:  Left Ankle Dorisflexion:  Limited by 75%  Left Ankle Plantar Flexion:  Limited by 50%         Strength:  Right Hip Flexion:  4+/5  Right Knee Flexion:  4+/5  Right Knee Extension: 4+/5  Right Ankle Dorsiflexion:  4+/5  Right Ankle Plantar Flexion:  4+/5  Left Hip Flexion:  3+/5  Left Knee Flexion:  4/5  Left Knee Extension:  4/5  Left Ankle Dorsiflexion:  3/5  Left Ankle Plantar Flexion:  3/5      Functional Mobility:    Bed Mobility:  Rolling to Left:  Supervision.        Supine to Sit:   contact guard for getting all the way to the edge of bed.   HOB elevated, assist at trunk  Seated Scooting:   Minimal assist    Transfers:  Sit to Stand:  Maximal assist with Front wheeled walker, Gait belt.    Cues for Hand Placement  Stand to Sit:  Minimal assist.    Cues for Hand Placement  Stand Pivot:   Minimal assist Moderate assist with Front wheeled walker, Gait belt.   Cues for Sequencing, Cues for Hand Placement, Cues for Foot Placement, Cues for Dan Humphreys  Management    Locomotion:  Not tested due to significant pain in left ankle with weight bearing    AM-PAC "6 Clicks" Basic Mobility Inpatient Short Form  Turning Over in Bed: A little  Sitting Down On/Standing From Armchair: A lot  Lying on Back to Sitting on Side of Bed: A little  Assist Moving to/from Bed to Chair: A little  Assist to Walk in Hospital Room: A lot  Assist to Climb 3-5 Steps with Railing:  Total  PT Basic Mobility Raw Score: 14  CMS 0-100% Score: 61.29%                 Participation and Activity Tolerance:  Participation effort: Good  Activity Tolerance: Tolerates 30 minutes of activity with rest breaks    Treatment Interventions this session:   Evaluation  Ice pack application    Education Provided:   TOPICS: role of physical therapy, plan of care, goals of therapy and safety with mobility and ADLs, benefits of activity, home safety, use of adaptive equipment    Learner educated: Patient  Method: Explanation  Response to education: Needs reinforcement    Patient Position at End of Treatment:   Sitting, in a chair, in the room, Needs in reach, Bed/chair alarm set, No distress and Ice applied    Team Communication:   Spoke to: RN/LPN - Sue Lush, OT Thea Silversmith  Regarding: Pre-session re: patient status, Patient position at end of session, Discharge needs, Patient participation with Therapy, Vital signs  Whiteboard updated: Yes  PT updated communication board to reflect current mobility level (PMP 5) with recommendations to use gait belt and FWW for stand pivot transfer from bed to chair with min/mod assist.    PT/PTA communication: via written note and verbal communication as needed.      Melrose Nakayama, PT, DPT

## 2020-11-22 NOTE — UM Notes (Signed)
[facility] Utilization Management Review Sheet    Facility :  [facility]    NAME: Carla Cannon  MR#: [id number]    CSN#: [id number]    ROOM: 308/308-A AGE: 87 y.o.    ADMIT DATE AND TIME:  2/10 @ 1848    PATIENT CLASS: Inpatient    ATTENDING PHYSICIAN: Judi Cong, DO      PAYOR:Payor: MEDICARE / Plan: MEDICARE PART A AND B / Product Type: Medicare /     AUTH #: No auth req'd    DIAGNOSIS:     ICD-10-CM    1. Frequent falls  R29.6    2. Contusion of left foot, initial encounter  S90.32XA        HISTORY:   Past Medical History:   Diagnosis Date    Age-related physical debility     11/21/20 admission    Anemia     Arthritis     Arthritis     Hypertension     Osteoarthritis     Osteopenia     Prediabetes     Pulmonary HTN     RLS (restless legs syndrome)      History of present illness:  Patient admitted through emergency department who presented s/p fall and debility.    Vitals upon admission:  98.2-168/80-94-16-100%    Physical exam:  Patient refused exam x 3.    Abnormal labs:    Urinalysis with Microscopic if Indicated    Collection Time: 11/21/20  3:26 PM   Result Value Ref Range    Color, UA Yellow Colorless,Yellow,Light-Yellow    Clarity, UA Clear Clear    Urine Specific Gravity 1.022     pH, Urine 6.5 5.0 - 8.0 pH    Protein, UR 20 (A) Negative mg/dL    Glucose, UA Negative Negative mg/dL    Ketones UA Negative Negative mg/dL    Bilirubin, UA Negative Negative    Blood, UA 0.2 (A) Negative    Nitrite, UA Negative Negative    Urobilinogen, UA 6.0 (A) Normal mg/dL    Leukocyte Esterase, UA Negative Negative Leu/uL    UR Micro Performed     WBC, UA 0 0 - 4 /hpf    RBC, UA 6 (H) 0 - 5 /hpf    Squam Epithel, UA 3 (H) 0 - 2 /lpf     Imaging:  NA    Assessment/Plan:  Admit as inpatient for extreme physical debility  Vital signs q8hrs  Aspiration precautions  Fall precautions  SLP eval and treat  PT/OT eval and treat  Consult SW  Lovenox 40mg  SQ daily    Inpatient  appropriate.    Freda Munro BSN RN  UM Nurse, [facility] Utilization Management  Integrated Care Coordination Department  [facility] [address]  [address]  [place], [place] [address]  Direct line [phone]  Fax: [phone]          VITALS: BP 144/85    Pulse 71    Temp 97.9 F (36.6 C) (Temporal)    Resp 16    Ht 1.549 m (5\' 1" )    Wt 72.3 kg (159 lb 6.3 oz)    SpO2 97%    BMI 30.12 kg/m

## 2020-11-22 NOTE — Progress Notes (Signed)
Medicine Progress Note   [facility]   Patient Name: Carla Cannon, Carla Cannon LOS: 1 days   Attending Physician: Judi Cong, DO PCP: Gershon Crane, FNP      Hospital Course:                                                            Carla Cannon is a 87 year old female with a past medical history of hypertension and restless leg syndrome who presents to the emergency department with her niece due to extreme age-related physical debility.     Assessment and Plan:    Extreme physical debility, age-related  Brought to emergency department by niece.  Niece visits from [place] twice monthly.  Over the last few visits the niece is noted severe debility, stating that her aunt is unable to get out of bed by herself.  Today while visiting her aunt she attempted to help the patient to the restroom and fell to the floor with patient.  Fall was controlled and neither suffered injury.  Unable to get her and off the floor, niece called EMS.  She states she is unable to take care of her anymore.  Of note patient had a recent fall 11/17/2020.  She was worked up in the ED without evidence of acute findings and discharged back home.  This a.m. patient is pleasantly demented. Exam is without acute findings.  Admit inpatient for severe age-related debility  Plan for placement versus hospice  Consult social work today.     Hypertension  Some elevated pressures in the ED  Previously on lisinopril-hydrochlorothiazide combination  No longer taking medication     Restless leg  Previously on ropinirole  No longer taking medication     Dementia, age-related  Oriented to self and place only        DVT PPx: Lovenox   Dispo: Inpatient  Healthcare Proxy: Nephew   Code: do not resuscitate   Subjective   Patient is doing quite well this morning. Much more alert than yesterday. No complaints. Hungry and would like coffee.        Objective   Physical Exam:   Vitals:  T:98.2 F (36.8 C) (Temporal), BP:(!) 163/97, HR:84, RR:19, SaO2:97%    General: Patient is awake. In no acute distress. Alert and oriented to place & self  HEENT: No conjunctival drainage, vision is intact, anicteric sclera.  Neck: Supple, no thyromegaly.  Chest: CTA bilaterally. No rhonchi, no wheezing. No use of accessory muscles.  CVS: Normal rate and regular rhythm no murmurs, without JVD, no pitting edema, pulses palpable.  Abdomen: Soft, non-tender, no guarding or rigidity, with normal bowel sounds.  Extremities: No calf swelling and no gross deformity.  Skin: Warm, dry, no rash and no worrisome lesions.  NEURO: No gross motor or sensory deficits appreciated  Psychiatric: Alert, interactive, appropriate, normal affect.  Weight Monitoring 09/09/2015 10/18/2019 10/16/2020 11/17/2020 11/21/2020 11/21/2020 11/21/2020   Height - 157.5 cm - 154.9 cm 154.9 cm 162.6 cm 154.9 cm   Height Method - Stated - Stated Stated Stated Stated   Weight 60.782 kg 60.8 kg 73.029 kg 73.936 kg 75.751 kg 75.932 kg 72.3 kg   Weight Method Stated Stated - Standing Scale Bed Scale Bed Scale  Bed Scale   BMI (calculated) - 24.6 kg/m2 - 30.9 kg/m2 31.6 kg/m2 28.8 kg/m2 30.2 kg/m2       No intake or output data in the 24 hours ending 11/22/20 0743  Body mass index is 30.12 kg/m.     Meds:     Current Facility-Administered Medications   Medication Dose Route Frequency    aspirin EC  81 mg Oral Daily    enoxaparin  40 mg Subcutaneous Q24H    sodium chloride (PF)  3 mL Intravenous Q12H SCH        PRN Meds: naloxone.     LABS:     Estimated Creatinine Clearance: 40.2 mL/min (based on SCr of 0.71 mg/dL).  Recent Labs   Lab 11/21/20  1526   WBC 8.5   RBC 4.38   Hemoglobin 12.8   Hematocrit 40.1   MCV 92   PLT CT 253         Recent Labs   Lab 11/21/20  1526   Troponin I 0.02   Creatine Kinase (CK) 125     No results found for: [id number]  Recent Labs   Lab 11/21/20  1526   Glucose 177*   Sodium 141   Potassium 3.8   Chloride 107   CO2 26   BUN 17    Creatinine 0.71   EGFR 71   Calcium 8.8     Recent Labs   Lab 11/21/20  1526   Magnesium 2.0   Albumin 3.0*   Protein, Total 6.7   Bilirubin, Total 1.3*   Alkaline Phosphatase 121   ALT 10   AST (SGOT) 18     Recent Labs   Lab 11/21/20  1526   Urine Specific Gravity 1.022   pH, Urine 6.5   Protein, UR 20*   Glucose, UA Negative   Ketones UA Negative   Bilirubin, UA Negative   Blood, UA 0.2*   Nitrite, UA Negative   Urobilinogen, UA 6.0*   Leukocyte Esterase, UA Negative   WBC, UA 0   RBC, UA 6*      Patient Lines/Drains/Airways Status       Active PICC Line / CVC Line / PIV Line / Drain / Airway / Intraosseous Line / Epidural Line / ART Line / Line / Wound / Pressure Ulcer / NG/OG Tube       Name Placement date Placement time Site Days    Peripheral IV 11/21/20 20 G Left Antecubital 11/21/20  1547  Antecubital  less than 1                   XR Ankle Left 3+ Views    Result Date: 11/17/2020  IMPRESSION: No acute bony abnormality in the ankle. ReadingStation:[id number]    XR Foot Left AP Lateral And Oblique    Result Date: 11/17/2020  1.  No acute fracture or traumatic malalignment. 2.  Moderate first MTP, interphalangeal and mild midfoot degenerative change. 3.  Osteopenia, which obscures fine bony detail. 4.  Fixed flexion at the second through fifth toes. 5.  Calcaneal enthesophytes. 6.  Arterial calcifications. ReadingStation:[email or web address]     Home Health Needs:  There are no questions and answers to display.       Nutrition assessment done in collaboration with Registered Dietitians:     Time spent: 45 minutes     Johna Roles, DO     11/22/20,7:43 AM   MRN: [id number]  CSN: [id number] DOB: 1920-09-16     Attending Physician Attestation:     I was present during or personally performed key portions of the service documented above by Dr. Leonard Schwartz, the resident physician. I discussed with the resident, and was directly involved in the management of the patient. I agree with the note as  written .

## 2020-11-22 NOTE — Progress Notes (Addendum)
NURSE NOTE SUMMARY  [facility]   Patient Name: Carla Cannon, Carla Cannon   Attending Physician: Judi Cong, DO   Today's date:   11/22/2020 LOS: 1 days   Shift Summary:                                                              0700 Report recived from Attah RN for transfer of pt care, pt resting in bed on RA; breathing is even and easy at rest. Denies any needs at this time, falls precautions maintained. Call bell within reach and bed alarm on  0830 Pt resting in bed, sitting up for breakfast. Pt given medications per MD orders, pt tolerated well. Assisted pt in making a phone call to a friend. Assessment complete and VSS. Pt denies any needs at this time  1020 SLP with pt, pt in bedside chair denies any needs at this time  1300 Pt asleep in bedside chair, breathing is unlabored and even. Call bell within reach and chair alarm on.   1430 Pt asleep in bedside chair, no signs of distress. Fall precautions maintained, Call bell within reach  1730 Pt in bedside chair resting watching TV, denies any needs at this time. Call bell and bedside table within reach. Chair alarm on  1900 Report given to Nma RN for transfer of pt care   Provider Notifications:        Rapid Response Notifications:  Mobility:      PMP Activity: Step 3 - Bed Mobility (11/21/2020  9:00 PM)     Weight tracking:  Family Dynamic:   Last 3 Weights for the past 72 hrs (Last 3 readings):   Weight   11/21/20 2025 72.3 kg (159 lb 6.3 oz)   11/21/20 1508 75.9 kg (167 lb 6.4 oz)   11/21/20 1500 75.8 kg (167 lb)             Last Bowel Movement   No data recorded

## 2020-11-23 DIAGNOSIS — F0390 Unspecified dementia without behavioral disturbance: Secondary | ICD-10-CM

## 2020-11-23 DIAGNOSIS — G2581 Restless legs syndrome: Secondary | ICD-10-CM

## 2020-11-23 DIAGNOSIS — R54 Age-related physical debility: Secondary | ICD-10-CM

## 2020-11-23 NOTE — Plan of Care (Signed)
Problem: Moderate/High Fall Risk Score >5  Goal: Patient will remain free of falls  Outcome: Progressing  Flowsheets (Taken 11/23/2020 0300)  VH High Risk (Greater than 13): ALL REQUIRED MODERATE INTERVENTIONS     Problem: Pain interferes with ability to perform ADL  Goal: Pain at adequate level as identified by patient  Outcome: Progressing  Flowsheets (Taken 11/22/2020 1030 by Colman Cater, RN)  Pain at adequate level as identified by patient:   Identify patient comfort function goal   Assess for risk of opioid induced respiratory depression, including snoring/sleep apnea. Alert healthcare team of risk factors identified.   Assess pain on admission, during daily assessment and/or before any "as needed" intervention(s)   Reassess pain within 30-60 minutes of any procedure/intervention, per Pain Assessment, Intervention, Reassessment (AIR) Cycle   Evaluate if patient comfort function goal is met   Evaluate patient's satisfaction with pain management progress     Problem: Side Effects from Pain Analgesia  Goal: Patient will experience minimal side effects of analgesic therapy  Outcome: Progressing  Flowsheets (Taken 11/21/2020 2137 by Rocky Link, RN)  Patient will experience minimal side effects of analgesic therapy:   Monitor/assess patient's respiratory status (RR depth, effort, breath sounds)   Assess for changes in cognitive function     Problem: Compromised Skin Integrity (Peds)  Goal: Child's skin integrity is maintained or improved  Outcome: Progressing  Flowsheets  Taken 11/23/2020 0325 by Primus Bravo, RN  Mobility/Activity: 3  Sensory Perception: 1  Moisture: 4  Friction/Shear: 4  Nutrition: 4  Taken 11/21/2020 2137 by Rocky Link, RN  Child's skin integrity is maintained or improved:   Reposition patient every 2 hours and as needed, with hands-on care (NICU)   Assess and monitor skin integrity   Perform active/passive ROM     Problem: Nutrition: Integumentary (Peds)  Goal: Child's  Nutritional intake adequate for growth or improving  Outcome: Progressing  Flowsheets (Taken 11/21/2020 2137 by Rocky Link, RN)  Child's nutritional intake is adequate for growth or improving:   Monitor and compare daily weight, monitor intake and output   Include patient/family in decisions related to nutrition/dietary selections     Problem: Compromised Tissue integrity  Goal: Damaged tissue is healing and protected  Outcome: Progressing  Flowsheets (Taken 11/22/2020 1030 by Colman Cater, RN)  Damaged tissue is healing and protected:   Monitor/assess Braden scale every shift   Provide wound care per wound care algorithm   Increase activity as tolerated/progressive mobility   Relieve pressure to bony prominences for patients at moderate and high risk   Avoid shearing injuries   Keep intact skin clean and dry   Encourage use of lotion/moisturizer on skin   Monitor patient's hygiene practices  Goal: Nutritional status is improving  Outcome: Progressing

## 2020-11-23 NOTE — Progress Notes (Signed)
Medicine Progress Note   [place] [facility]   Patient Name: Carla Cannon, Carla Cannon LOS: 2 days   Attending Physician: Carla Cong, DO PCP: Carla Crane, FNP      Hospital Course:                                                            Carla Cannon is a 87 year old female with a past medical history of hypertension and restless leg syndrome who presents to the emergency department with her niece due to extreme age-related physical debility.     Assessment and Plan:    Physical debility  PT/OT evaluated the pt and are recommending SNF placement  SLP following   Pt is decisional and wants to go home; will try to implore the importance of SNF placement to continue to work with PT/OT to improve strength and function as home would not be safe for the pt in her current state      Restless leg  Previously on ropinirole  No longer taking medication     Dementia, age-related  Cont to monitor      DVT PPx: Lovenox   Dispo: Inpatient  Healthcare Proxy: Nephew   Code: do not resuscitate   Subjective   Feels well this morning. Denies pain. Slept ok overnight but did have a nightmare. Would like to go home.        Objective   Physical Exam:   Vitals: T:97.9 F (36.6 C) (Temporal), BP:152/82, HR:84, RR:16, SaO2:94%    General: Patient is awake. In no acute distress. Alert and oriented to place & self  HEENT: No conjunctival drainage, vision is intact, anicteric sclera.  Neck: Supple, no thyromegaly.  Chest: CTA bilaterally. No rhonchi, no wheezing. No use of accessory muscles.  CVS: Normal rate and regular rhythm no murmurs, without JVD, no pitting edema, pulses palpable.  Abdomen: Soft, non-tender, no guarding or rigidity, with normal bowel sounds.  Extremities: No calf swelling and no gross deformity.  Skin: Warm, dry, no rash and no worrisome lesions.  NEURO: No gross motor or sensory deficits appreciated. A&Ox3  Psychiatric: Alert, interactive,  appropriate, normal affect.  Weight Monitoring 09/09/2015 10/18/2019 10/16/2020 11/17/2020 11/21/2020 11/21/2020 11/21/2020   Height - 157.5 cm - 154.9 cm 154.9 cm 162.6 cm 154.9 cm   Height Method - Stated - Stated Stated Stated Stated   Weight 60.782 kg 60.8 kg 73.029 kg 73.936 kg 75.751 kg 75.932 kg 72.3 kg   Weight Method Stated Stated - Standing Scale Bed Scale Bed Scale Bed Scale   BMI (calculated) - 24.6 kg/m2 - 30.9 kg/m2 31.6 kg/m2 28.8 kg/m2 30.2 kg/m2         Intake/Output Summary (Last 24 hours) at 11/23/2020 0726  Last data filed at 11/22/2020 1800  Gross per 24 hour   Intake 820 ml   Output --   Net 820 ml     Body mass index is 30.12 kg/m.     Meds:     Current Facility-Administered Medications   Medication Dose Route Frequency    aspirin EC  81 mg Oral Daily    enoxaparin  40 mg Subcutaneous Q24H    nystatin   Topical BID    sodium chloride (PF)  3 mL Intravenous Q12H SCH        PRN Meds: naloxone.     LABS:     Estimated Creatinine Clearance: 45.3 mL/min (based on SCr of 0.63 mg/dL).  Recent Labs   Lab 11/22/20  0819 11/21/20  1526   WBC 6.2 8.5   RBC 4.31 4.38   Hemoglobin 12.1 12.8   Hematocrit 39.2 40.1   MCV 91 92   PLT CT 247 253         Recent Labs   Lab 11/21/20  1526   Troponin I 0.02   Creatine Kinase (CK) 125     No results found for: [id number]  Recent Labs   Lab 11/22/20  0819 11/21/20  1526   Glucose 110* 177*   Sodium 139 141   Potassium 3.5 3.8   Chloride 107 107   CO2 24 26   BUN 11 17   Creatinine 0.63 0.71   EGFR 75 71   Calcium 8.4* 8.8     Recent Labs   Lab 11/22/20  0819 11/21/20  1526   Magnesium  --  2.0   Albumin 2.8* 3.0*   Protein, Total 6.0 6.7   Bilirubin, Total 1.7* 1.3*   Alkaline Phosphatase 97 121   ALT 12 10   AST (SGOT) 17 18     Recent Labs   Lab 11/21/20  1526   Urine Specific Gravity 1.022   pH, Urine 6.5   Protein, UR 20*   Glucose, UA Negative   Ketones UA Negative   Bilirubin, UA Negative   Blood, UA 0.2*   Nitrite, UA Negative   Urobilinogen, UA 6.0*   Leukocyte  Esterase, UA Negative   WBC, UA 0   RBC, UA 6*      Patient Lines/Drains/Airways Status       Active PICC Line / CVC Line / PIV Line / Drain / Airway / Intraosseous Line / Epidural Line / ART Line / Line / Wound / Pressure Ulcer / NG/OG Tube       Name Placement date Placement time Site Days    Peripheral IV 11/21/20 20 G Left Antecubital 11/21/20  1547  Antecubital  less than 1                   XR Ankle Left 3+ Views    Result Date: 11/17/2020  IMPRESSION: No acute bony abnormality in the ankle. ReadingStation:[id number]    XR Foot Left AP Lateral And Oblique    Result Date: 11/17/2020  1.  No acute fracture or traumatic malalignment. 2.  Moderate first MTP, interphalangeal and mild midfoot degenerative change. 3.  Osteopenia, which obscures fine bony detail. 4.  Fixed flexion at the second through fifth toes. 5.  Calcaneal enthesophytes. 6.  Arterial calcifications. ReadingStation:[email or web address]     Home Health Needs:  There are no questions and answers to display.       Nutrition assessment done in collaboration with Registered Dietitians:        Carla Emerald, MD     11/23/20,7:26 AM   MRN: [id number]                                      CSN: [id number] DOB: 03-31-1920          Attending Physician Attestation:     I was present during or personally performed key portions of  the service documented above by Carla Cannon, the resident physician. I discussed with the resident, and was directly involved in the management of the patient. I agree with the note as written. Carla Cannon is comfortable this AM. She ate breakfast. She needs assistance with transfers. Currently she is non ambulatory. Therapy is recommending SNF. Pt would like to return home eventually but she recognizes that she cannot live alone if she cannot perform basic ADLs without help. She would be amenable with SNF. She would not be amenable with LTC (which would be her nieces preference). Carla Cannon admits she is forgetful at times, but she is oriented x 3 and  provides a quite detailed short term and long term history. She understands pros/cons of remaining in her own home. Again, she is open to SNF if she is unsafe for home. At this moment, I feel she is competent to make decisions, and her thought processes are rational.

## 2020-11-23 NOTE — Plan of Care (Signed)
Problem: Moderate/High Fall Risk Score >5  Goal: Patient will remain free of falls  Outcome: Progressing     Problem: Pain interferes with ability to perform ADL  Goal: Pain at adequate level as identified by patient  Description: Interventions:  1. Identify patient comfort function goal  2. Evaluate if patient comfort function goal is met  3. Assess pain on admission, during daily assessment and/or before any "as needed" intervention(s)  4. Reassess pain within 30-60 minutes of any procedure/intervention, per Pain Assessment, Intervention, Reassessment (AIR) Cycle  5. Evaluate patient's satisfaction with pain management progress  6. Offer non-pharmacological pain management interventions  7. Consult/collaborate with Pain Service  8. Consult/collaborate with Physical Therapy, Occupational Therapy, and/or Speech Therapy  9. Assess for risk of opioid induced respiratory depression and side effects, including snoring/sleep apnea. Alert healthcare team of risk factors identified.  10. Include patient/patient care companion in decisions related to pain management as needed  Outcome: Progressing     Problem: Side Effects from Pain Analgesia  Goal: Patient will experience minimal side effects of analgesic therapy  Description: Interventions:  1. Monitor/assess patient's respiratory status (RR depth, effort, breath sounds)  2. Assess for changes in cognitive function   3. Prevent/manage side effects per LIP orders (i.e. nausea, vomiting, pruritus, constipation, urinary retention, etc.)  4. Evaluate for opioid-induced sedation with appropriate assessment tool (i.e. POSS)  Outcome: Progressing     Problem: Nutrition: Integumentary (Peds)  Goal: Child's Nutritional intake adequate for growth or improving  Description: Interventions:  1. Monitor and compare daily weight, monitor intake and output  2. Ensure appropriate diet/adequate calories for growth  3. Assess tolerance of feeds (nausea, vomiting, diarrhea, fatigue,  reflux)  4. Ensure appropriate consults are obtained (Nutrition, Speech Therapy, Occupational Therapy)  5. Include patient/family in decisions related to nutrition/dietary selections  6. Reflux precautions  Outcome: Progressing     Problem: Compromised Tissue integrity  Goal: Damaged tissue is healing and protected  Description: Interventions:  1. Monitor/assess Braden scale every shift  2. Provide wound care per wound care algorithm  3. Reposition patient every 2 hours and as needed unless able to reposition self  4. Increase activity as tolerated/progressive mobility  5. Relieve pressure to bony prominences for patients at moderate and high risk  6. Avoid shearing injuries   7. Keep intact skin clean and dry  8. Use bath wipes, not soap and water, for daily bathing   9. Use incontinence wipes for cleaning urine, stool and caustic drainage; Foley care as needed   10. Monitor external devices/tubes for correct placement to prevent pressure, friction and shearing   11. Encourage use of lotion/moisturizer on skin  12. Monitor patient's hygiene practices  13. Consult/collaborate with wound care nurse   14. Utilize specialty bed  15. Consider placing an indwelling catheter if incontinence interferes with healing of stage 3 or 4 pressure injury  Outcome: Progressing  Goal: Nutritional status is improving  Description: Interventions:  1. Assist patient with eating   2. Allow adequate time for meals   3. Encourage patient to take dietary supplement(s) as ordered   4. Collaborate with Clinical Nutritionist  5. Include patient/patient care companion in decisions related to nutrition  Outcome: Progressing

## 2020-11-24 DIAGNOSIS — I272 Pulmonary hypertension, unspecified: Secondary | ICD-10-CM

## 2020-11-24 NOTE — Plan of Care (Signed)
Problem: Moderate/High Fall Risk Score >5  Goal: Patient will remain free of falls  Outcome: Progressing      Problem: Compromised Tissue integrity  Goal: Damaged tissue is healing and protected  Outcome: Progressing

## 2020-11-24 NOTE — Plan of Care (Signed)
Problem: Moderate/High Fall Risk Score >5  Goal: Patient will remain free of falls  Flowsheets (Taken 11/24/2020 0300)  VH High Risk (Greater than 13): BED ALARM WILL BE ACTIVATED WHEN THE PATEINT IS IN BED WITH SIGNAGE "RESET BED ALARM"     Problem: Pain interferes with ability to perform ADL  Goal: Pain at adequate level as identified by patient  Flowsheets (Taken 11/22/2020 1030 by Colman Cater, RN)  Pain at adequate level as identified by patient:   Identify patient comfort function goal   Assess for risk of opioid induced respiratory depression, including snoring/sleep apnea. Alert healthcare team of risk factors identified.   Assess pain on admission, during daily assessment and/or before any "as needed" intervention(s)   Reassess pain within 30-60 minutes of any procedure/intervention, per Pain Assessment, Intervention, Reassessment (AIR) Cycle   Evaluate if patient comfort function goal is met   Evaluate patient's satisfaction with pain management progress     Problem: Side Effects from Pain Analgesia  Goal: Patient will experience minimal side effects of analgesic therapy  Flowsheets (Taken 11/21/2020 2137 by Rocky Link, RN)  Patient will experience minimal side effects of analgesic therapy:   Monitor/assess patient's respiratory status (RR depth, effort, breath sounds)   Assess for changes in cognitive function     Problem: Compromised Skin Integrity (Peds)  Goal: Child's skin integrity is maintained or improved  Flowsheets  Taken 11/24/2020 0329 by Primus Bravo, RN  Mobility/Activity: 3  Sensory Perception: 1  Moisture: 4  Friction/Shear: 4  Nutrition: 4  Tissue perfusion and Oxygenation: 3  Starkid Skin Score: 19  Taken 11/21/2020 2137 by Rocky Link, RN  Child's skin integrity is maintained or improved:   Reposition patient every 2 hours and as needed, with hands-on care (NICU)   Assess and monitor skin integrity   Perform active/passive ROM     Problem: Nutrition: Integumentary  (Peds)  Goal: Child's Nutritional intake adequate for growth or improving  Flowsheets (Taken 11/21/2020 2137 by Rocky Link, RN)  Child's nutritional intake is adequate for growth or improving:   Monitor and compare daily weight, monitor intake and output   Include patient/family in decisions related to nutrition/dietary selections     Problem: Compromised Tissue integrity  Goal: Damaged tissue is healing and protected  Flowsheets (Taken 11/22/2020 1030 by Colman Cater, RN)  Damaged tissue is healing and protected:   Monitor/assess Braden scale every shift   Provide wound care per wound care algorithm   Increase activity as tolerated/progressive mobility   Relieve pressure to bony prominences for patients at moderate and high risk   Avoid shearing injuries   Keep intact skin clean and dry   Encourage use of lotion/moisturizer on skin   Monitor patient's hygiene practices  Goal: Nutritional status is improving  Flowsheets (Taken 11/24/2020 0329)  Nutritional status is improving:   Allow adequate time for meals   Assist patient with eating

## 2020-11-24 NOTE — Progress Notes (Addendum)
NURSE NOTE SUMMARY  [facility]   Patient Name: Carla Cannon   Attending Physician: Judi Cong, DO   Today's date:   11/24/2020 LOS: 3 days   Shift Summary:                                                              0700 Report received from [facility] RN for transfer of pt care, pt sleeping in bedside chair; which she preferred to sleep in. Breathing is even and unlabored at rest. Call bell within reach.   0845 Pt resting in bedside chair, given medication per provider orders. Denies any current pain or needs at this time, VSS. Pt eating breakfast. Call bell within reach and chair alarm on  1020 Pt resting in bedside chair, denies any needs at this time  1230 Pt assisted to [facility] for toileting needs, pt safely transferred back to chair using gait belt and walker. Pt provided with warm blankets and ice pack for left foot/ ankle. Call bell within reach and chair alarm on for safety.  1520 Pt resting in bedside chair, no signs of distress apparent. Continuing to monitor. Call bell within reach and chair alarm on  1600 Family visiting with pt  1800 Pt given medication per provider orders. Pt sleeping in bedside chair, easily arousalable. Denies any other needs at this time, declined icepack. Call bell within reach and chair alarm on  1900 Report given to [facility] for transfer of pt care   Provider Notifications:        Rapid Response Notifications:  Mobility:      PMP Activity: Step 5 - Chair (11/24/2020  3:00 AM)     Weight tracking:  Family Dynamic:   Last 3 Weights for the past 72 hrs (Last 3 readings):   Weight   11/21/20 2025 72.3 kg (159 lb 6.3 oz)   11/21/20 1508 75.9 kg (167 lb 6.4 oz)   11/21/20 1500 75.8 kg (167 lb)             Last Bowel Movement   No data recorded

## 2020-11-24 NOTE — Plan of Care (Signed)
Problem: Moderate/High Fall Risk Score >5  Goal: Patient will remain free of falls  Outcome: Progressing  Flowsheets (Taken 11/24/2020 0845)  VH High Risk (Greater than 13):   ALL REQUIRED LOW INTERVENTIONS   ALL REQUIRED MODERATE INTERVENTIONS   RED "HIGH FALL RISK" SIGNAGE   BED ALARM WILL BE ACTIVATED WHEN THE PATEINT IS IN BED WITH SIGNAGE "RESET BED ALARM"   A CHAIR PAD ALARM WILL BE USED WHEN PATIENT IS UP SITTING IN A CHAIR   PATIENT IS TO BE SUPERVISED FOR ALL TOILETING ACTIVITIES   Keep door open for better visibility     Problem: Pain interferes with ability to perform ADL  Goal: Pain at adequate level as identified by patient  Outcome: Progressing  Flowsheets (Taken 11/22/2020 1030)  Pain at adequate level as identified by patient:   Identify patient comfort function goal   Assess for risk of opioid induced respiratory depression, including snoring/sleep apnea. Alert healthcare team of risk factors identified.   Assess pain on admission, during daily assessment and/or before any "as needed" intervention(s)   Reassess pain within 30-60 minutes of any procedure/intervention, per Pain Assessment, Intervention, Reassessment (AIR) Cycle   Evaluate if patient comfort function goal is met   Evaluate patient's satisfaction with pain management progress     Problem: Compromised Tissue integrity  Goal: Damaged tissue is healing and protected  Outcome: Progressing  Flowsheets (Taken 11/22/2020 1030)  Damaged tissue is healing and protected:   Monitor/assess Braden scale every shift   Provide wound care per wound care algorithm   Increase activity as tolerated/progressive mobility   Relieve pressure to bony prominences for patients at moderate and high risk   Avoid shearing injuries   Keep intact skin clean and dry   Encourage use of lotion/moisturizer on skin   Monitor patient's hygiene practices  Goal: Nutritional status is improving  Outcome: Progressing  Flowsheets (Taken 11/24/2020 1008)  Nutritional status is  improving:   Assist patient with eating   Allow adequate time for meals   Include patient/patient care companion in decisions related to nutrition

## 2020-11-24 NOTE — Progress Notes (Signed)
PROGRESS NOTE    Date Time: 11/24/20 10:04 AM  Patient Name: Carla Cannon  Attending Physician: Judi Cong, DO  Date of admission: 11/21/2020  Length of Stay: 3 days    Assessment:                                                                                         Principal Problem:    Age-related physical debility  Active Problems:    History of recent fall    Contusion of left foot      Jannat Rosemeyer is a 87 year old female with a past medical history of hypertension and restless leg syndrome who presents to the emergency department with her niece due to extreme age-related physical debility.    Plan:                                                                                                       Physical debility  PT/OT evaluated the pt and are recommending SNF placement  SLP following   SW discussion 2/14 re placement      L ankle contusion, improving  Evaluated in ED 2/6, no fracture  Cont RICE therapy    Restless leg  Previously on ropinirole  No longer taking medication     Dementia, age-related  Cont to monitor     Hypertension, chronic  Pulmonary hypertension, chronic  Not on medications     DVT PPx: Lovenox   Dispo: Inpatient  Healthcare Proxy: Nephew   Code: do not resuscitate      Subjective                                                                                               Patient reports she slept very well.  Denies chest pain, shortness of breath, abdominal pain.  She voided but has not had a bowel movement overnight.  She is aware that the next step is having a discussion with social work about placement.      Review of Systems:  Review of Systems   Constitutional: Negative for fatigue and fever.   HENT: Negative for congestion.    Respiratory: Negative for shortness of breath.    Cardiovascular: Negative for chest pain and leg swelling.   Gastrointestinal: Negative for abdominal pain.    Neurological: Positive for weakness.          Physical Exam:                                                                                     Temp:  [97 F (36.1 C)-98.8 F (37.1 C)] 97 F (36.1 C)  Heart Rate:  [85-93] 93  Resp Rate:  [16-18] 16  BP: (132-162)/(85-92) 147/85  No intake or output data in the 24 hours ending 11/24/20 1004    Physical Exam  Constitutional:       General: She is not in acute distress.     Appearance: Normal appearance. She is not ill-appearing, toxic-appearing or diaphoretic.      Comments: Frail-appearing   HENT:      Nose: No rhinorrhea.   Eyes:      Extraocular Movements: Extraocular movements intact.   Cardiovascular:      Rate and Rhythm: Normal rate and regular rhythm.      Heart sounds: Murmur (2/6 systolic) heard.       Pulmonary:      Effort: Pulmonary effort is normal. No respiratory distress.      Breath sounds: Normal breath sounds. No wheezing.   Abdominal:      Palpations: Abdomen is soft.      Tenderness: There is no abdominal tenderness.   Musculoskeletal:      Right lower leg: No edema.      Left lower leg: No edema.   Skin:     General: Skin is warm.   Neurological:      General: No focal deficit present.      Mental Status: She is alert and oriented to person, place, and time.   Psychiatric:         Mood and Affect: Mood normal.         Meds:                                                                                                    Medications were reviewed in the electronic record: [x]       Estimated Creatinine Clearance: 45.3 mL/min (based on SCr of 0.63 mg/dL).  Current Facility-Administered Medications   Medication Dose Route Frequency    aspirin EC  81 mg Oral Daily    enoxaparin  40 mg Subcutaneous Q24H    nystatin   Topical BID    sodium chloride (PF)  3 mL Intravenous Q12H [facility]  PRN medications: naloxone  IV Drips:        Lines:                                                                                                      Patient  Lines/Drains/Airways Status       Active PICC Line / CVC Line / PIV Line / Drain / Airway / Intraosseous Line / Epidural Line / ART Line / Line / Wound / Pressure Ulcer / NG/OG Tube       Name Placement date Placement time Site Days    Peripheral IV 11/21/20 20 G Left Antecubital 11/21/20  1547  Antecubital  2                      Labs and Imaging:                                                                                  Recent Labs   Lab 11/22/20  0819 11/21/20  1526   WBC 6.2 8.5   RBC 4.31 4.38   Hemoglobin 12.1 12.8   Hematocrit 39.2 40.1   MCV 91 92   PLT CT 247 253       Recent Labs   Lab 11/22/20  0819 11/21/20  1526   Sodium 139 141   Potassium 3.5 3.8   Chloride 107 107   CO2 24 26   BUN 11 17   Creatinine 0.63 0.71   Glucose 110* 177*   Calcium 8.4* 8.8   Magnesium  --  2.0       Recent Labs   Lab 11/22/20  0819 11/21/20  1526   ALT 12 10   AST (SGOT) 17 18   Bilirubin, Total 1.7* 1.3*   Albumin 2.8* 3.0*   Alkaline Phosphatase 97 121             Microbiology, reviewed and are significant for:  Microbiology Results (last 15 days)       ** No results found for the last 360 hours. **            Imaging, reviewed and are significant for:  No results found.    Signed by: Philip Aspen, MD  Resident Physician  Attending Physician Attestation:     I was present during or personally performed key portions of the service documented above by Dr. Judie Petit, the resident physician. I discussed with the resident, and was directly involved in the management of the patient. I agree with the note as written. Ankle swelling on L improved. Pain improving. Ice has helped. Still sore though. Still unsteady though. Therapy has recommend SNF. Pt amenable to SNF if she is unsafe at home, but does not agree to  LTC placement. She reports that prior to her injury she was reasonably safe in her home and able to perform ADLS and it is acute injury (plus deconditioning) that is acutely making her unsafe. She may be right. Either way, I  don't think she is incompetent to understand consequences of her decisions at this time.

## 2020-11-25 ENCOUNTER — Inpatient Hospital Stay: Payer: Medicare Other

## 2020-11-25 DIAGNOSIS — R937 Abnormal findings on diagnostic imaging of other parts of musculoskeletal system: Secondary | ICD-10-CM

## 2020-11-25 DIAGNOSIS — M79672 Pain in left foot: Secondary | ICD-10-CM

## 2020-11-25 NOTE — Progress Notes (Signed)
Quick Doc  [facility]   Patient Name: Carla Cannon   Attending Physician: Judi Cong, DO   Today's date:   11/25/2020 LOS: 4 days   Expected Discharge Date      Quick  Assessment:                                                              ReAdmit Risk Score: 11    CM Comments: 2/14 - SW - Fall - Ankle Injury - Seeking SNF placement for rehab - Referrals started - Awaiting bed offers        Per Attending MD note over weekend patient has capacity to make decisions regarding medical care    SW met with patient and niece Debarah Crape at bedside - Patient agreed to short term rehab placement only - She will not agreed to LTC - She prefers to remain in [employer] if at all possible    Clois Comber. Rylan Bernard, BSW  Social Worker  [facility]         Provider Notifications:

## 2020-11-25 NOTE — PT Progress Note (Signed)
[facility]  [address]  [place], [place] [address]    Department of Rehabilitation  [phone]    Physical Therapy Treatment Note    Josilyn Shippee    CSN: [id number]    [facility] [place] [facility] ACUTE CARE   308/308-A    Time of treatment:   Time Calculation  PT Received On: 11/25/20  Start Time: 1225  Stop Time: 1254  Time Calculation (min): 29 min    Visit#: 2    Medical Diagnosis/Pertinent medical/surgical details: Edeline Greening was admitted 11/21/2020 with extreme age-related physical debility. Pertinent PMH includes: frequent falls, L foot contusion, HTN, LE edema, debility, OA, osteopenia, restless leg, and anemia.    Precautions and Contraindications:  Aspiration precautions  Falls  Mobility protocol   Weight bearing with multi podus boot when OOB    Assessment:   Patients progress towards established goals: patient is making good progress towards goals. Patient was better able to tolerate weight bearing the use of multi podus boot for both sit to stand and taking steps. Patient able to take 5 steps forward and backward with FWW with minimal increase in pain. Patient also able to tolerated seated therex without pain at all joints. Discharge recommendations SNF for rehab to maximize safety with functional mobility for eventual discharge to home environment.     Patient continues to have the following impairments: decreased ROM, decreased strength, decreased safety/judgement during functional mobility, decreased activity tolerance, impaired motor control , decreased functional mobility, decreased balance, gait deficits, pain    Patient will continue to benefit from skilled PT services in order to maximize independence with functional mobility and regain strength and endurance.        Goals:  By the time of discharge:  -Patient will be able to perform supine<>sit with SBA assist for increased independence with bed mobility ONGOING-patient reports she has been  sleeping in recliner  -Patient will be able to perform stand pivot transfer with SBA assist and FWW to improve safe functional mobility ONGOING  -Patient will be able to ambulate 50 feet with FWW assist and contact guard for improved household mobility ONGOING-patient able to tolerate 5 steps x 2 reps today  -Patient will be able to participate in at least 5 seated LE exercises to improve LE strength and tolerance to activities. GOAL MET     Plan:   Treatment/interventions: Exercise, Gait training, Stair training, Neuromuscular re-education, Functional transfer training, LE strengthening/ROM, Cognitive reorientation, Patient/caregiver training, Equipment eval/education, Bed mobility, ice pack application    Treatment Frequency: 3-4x/wk    DISCHARGE RECOMMENDATIONS   DME recommended for Discharge:   TBD at next level of care     Discharge Recommendations:   SNF         Subjective:   "I can try to stand."     Patient is agreeable to participation in the therapy session. Nursing clears patient for therapy.     Pain:  Pain at rest: pt unable to use pain scale accurately but describes medium pain and no signs of pain seated. Mild facial grimace when weight bearing but does not report large increase in pain.   Location: Foot: left  Interventions: Cold applied, Repositioned, Rest   Pain after intervention: pain after 20 min of ice reported as mild no signs of pain  Pain scale used: FLACC    OBJECTIVE:   Observation of Patient/Vital Signs:   Patient is seated in a bedside chair with telemetry,  continuous pulse oximeter in place.    Patients medical condition is appropriate for Physical therapy intervention at this time.    Vital Signs:  BP Sitting: 146/86 mmHg  HR Sitting:  97 bpm  SpO2 sitting: 97% on room air    Edema: pain has not pitting edmea from knee to ankle, did not test foot due to pain  Skin Inspection: patient still has significant bruising on L forefoot and toes    Command following: Follows ALL commands and  directions without difficulty  Alertness/Arousal: Appropriate responses to stimuli   Attention Span:Appears intact  Memory: Appears intact  Safety Awareness: minimal verbal instruction  Insights: Decreased awareness of deficits  Problem Solving: Assistance required to implement solutions    Musculoskeletal and Balance Details:   Balance:  Static Sitting:  Good  Dynamic Sitting:  Good  Static Standing:  Good  Dynamic Standing:  Fair+           Functional Mobility:   Bed Mobility:   Not Tested due to patient sitting up in beside chair    Transfers:  Sit to Stand:  Minimal assist with Front wheeled walker, Gait belt, multi podus boot.    Cues for Sequencing, Cues for Hand Placement  Stand to Sit:  Minimal assist.    Cues for Sequencing, Cues for Hand Placement    Locomotion:  LEVEL AMBULATION:  Distance: 5 feet x 2   Assistance level:  Minimal assist  Device:  Front wheeled walker, Gait belt, multipodus boot  Pattern:  Step to, antalgic, decreased weight bearing through L LE    Participation and Activity Tolerance   Participation effort: Good    Activity Tolerance: Tolerates 30 minutes of activity with rest breaks  Other Treatment Interventions this session:   Therapeutic exercise:  Sitting:  Isometric hip abduction:    2x10   Isometric hip adduction:    2x10   Long Arc Quads:    2x10   Marching:    2x10   Heel/Toe raises:    2x10   Therapeutic activity     Education Provided:   TOPICS: role of physical therapy, plan of care, goals of therapy and safety with mobility and ADLs, benefits of activity, use of adaptive equipment, that patient needs to wear multipodus boot with any weight bearing  Learner educated: Patient, Family, niece present  Method: Explanation  Response to education: Verbalized understanding    Patient Position at End of Treatment:   Sitting, in a chair, in the room, Family/visitors present, Needs in reach, Bed/chair alarm set and No distress    Team Communication:   Spoke to : RN/LPN - Razan, OT -   Mackenzie  Regarding: Pre-session re: patient status, Patient position at end of session, Patient participation with Therapy, multipodus boot application  Whiteboard updated: Yes, PT updated communication board to reflect current mobility level (PMP 5) with recommendations to min A with FWW to stand and pivot from bed to chair with multipodus boot on.    PT/PTA communication: via written note and verbal communication as needed.      Melrose Nakayama, PT, DPT

## 2020-11-25 NOTE — Progress Notes (Signed)
Progress note    Patient w/ significant bruising, swelling, tenderness of L foot.  Discussed w/ radiology who felt there is likely a fracture at base of 2nd metatarsal.  Discussed w/ Dr. Rolly Salter, orthopedist, by phone.  He felt there could be multiple lisfranc fractures, but patient is not a good surgical candidate due to age and osteopenia.  She should be non-weightbearing unless she can wear a brace, and she should follow up with ortho outpatient for weightbearing xray in 3-4 weeks.    Will order brace for left foot to use when out of bed.  If not tolerated, should be non-weightbearing.    Rogelia Rohrer, MD  PGY3

## 2020-11-25 NOTE — Plan of Care (Signed)
Problem: Moderate/High Fall Risk Score >5  Goal: Patient will remain free of falls  Outcome: Progressing     Problem: Pain interferes with ability to perform ADL  Goal: Pain at adequate level as identified by patient  Outcome: Progressing     Problem: Compromised Tissue integrity  Goal: Damaged tissue is healing and protected  Outcome: Progressing

## 2020-11-25 NOTE — Plan of Care (Signed)
Problem: Moderate/High Fall Risk Score >5  Goal: Patient will remain free of falls  Outcome: Progressing  Flowsheets (Taken 11/24/2020 1905 by Evert Kohl, RN)  VH High Risk (Greater than 13):   ALL REQUIRED LOW INTERVENTIONS   ALL REQUIRED MODERATE INTERVENTIONS   RED "HIGH FALL RISK" SIGNAGE   BED ALARM WILL BE ACTIVATED WHEN THE PATEINT IS IN BED WITH SIGNAGE "RESET BED ALARM"   A CHAIR PAD ALARM WILL BE USED WHEN PATIENT IS UP SITTING IN A CHAIR   Keep door open for better visibility     Problem: Pain interferes with ability to perform ADL  Goal: Pain at adequate level as identified by patient  Outcome: Progressing  Flowsheets (Taken 11/22/2020 1030 by Colman Cater, RN)  Pain at adequate level as identified by patient:   Identify patient comfort function goal   Assess for risk of opioid induced respiratory depression, including snoring/sleep apnea. Alert healthcare team of risk factors identified.   Assess pain on admission, during daily assessment and/or before any "as needed" intervention(s)   Reassess pain within 30-60 minutes of any procedure/intervention, per Pain Assessment, Intervention, Reassessment (AIR) Cycle   Evaluate if patient comfort function goal is met   Evaluate patient's satisfaction with pain management progress

## 2020-11-25 NOTE — Progress Notes (Signed)
Medicine Progress Note   [facility]   Patient Name: Carla Cannon, Carla Cannon LOS: 4 days   Attending Physician: Judi Cong, DO PCP: Gershon Crane, FNP            Assessment and Plan:    Symia Herdt is a 87 year old female with a past medical history of hypertension and restless leg syndrome who presents to the emergency department with her niece due to extreme age-related physical debility.    Physical debility  PT/OT evaluated the pt and are recommending SNF placement  SLP following   Pt is amenable to rehab if going home is unsafe, though home would be her preference.    Restless leg  Previously on ropinirole  No longer taking medication    Dementia, age-related  A&O x 3.  Has insight into situation and does have capacity to make decisions    Disposition: inpatient  DVT PPX:  Lovenox  Code:  NO CPR  -  ALLOW NATURAL DEATH     Subjective   Patient feels well  This morning and denies any pain.  "Nothing is bother me."  Would like to eat breakfast.        Objective   Physical Exam:       Vitals: T:97.4 F (36.3 C) (Temporal), BP:156/84, HR:93, RR:18, SaO2:93%    Physical Exam  Constitutional:       General: She is not in acute distress.     Appearance: She is well-developed. She is not toxic-appearing.   HENT:      Head: Normocephalic.   Cardiovascular:      Rate and Rhythm: Normal rate and regular rhythm.      Heart sounds: Normal heart sounds. No murmur heard.      Pulmonary:      Effort: Pulmonary effort is normal. No respiratory distress.      Breath sounds: Normal breath sounds. No wheezing or rales.   Abdominal:      General: There is no distension.      Palpations: Abdomen is soft.      Tenderness: There is no abdominal tenderness.   Musculoskeletal:         General: No deformity.   Lymphadenopathy:      Cervical: No cervical adenopathy.   Skin:     General: Skin is warm and dry.      Findings: No rash.   Neurological:       General: No focal deficit present.      Mental Status: She is alert and oriented to person, place, and time.   Psychiatric:         Thought Content: Thought content normal.         Weight Monitoring 09/09/2015 10/18/2019 10/16/2020 11/17/2020 11/21/2020 11/21/2020 11/21/2020   Height - 157.5 cm - 154.9 cm 154.9 cm 162.6 cm 154.9 cm   Height Method - Stated - Stated Stated Stated Stated   Weight 60.782 kg 60.8 kg 73.029 kg 73.936 kg 75.751 kg 75.932 kg 72.3 kg   Weight Method Stated Stated - Standing Scale Bed Scale Bed Scale Bed Scale   BMI (calculated) - 24.6 kg/m2 - 30.9 kg/m2 31.6 kg/m2 28.8 kg/m2 30.2 kg/m2         Intake/Output Summary (Last 24 hours) at 11/25/2020 0800  Last data filed at 11/25/2020 0349  Gross per 24 hour   Intake 725 ml   Output 300 ml   Net  425 ml     Body mass index is 30.12 kg/m.     Meds:     Current Facility-Administered Medications   Medication Dose Route Frequency    enoxaparin  40 mg Subcutaneous Q24H    nystatin   Topical BID    sodium chloride (PF)  3 mL Intravenous Q12H SCH       PRN Meds: naloxone.     LABS:     Estimated Creatinine Clearance: 45.3 mL/min (based on SCr of 0.63 mg/dL).  Recent Labs   Lab 11/22/20  0819 11/21/20  1526   WBC 6.2 8.5   RBC 4.31 4.38   Hemoglobin 12.1 12.8   Hematocrit 39.2 40.1   MCV 91 92   PLT CT 247 253         Recent Labs   Lab 11/21/20  1526   Troponin I 0.02   Creatine Kinase (CK) 125     No results found for: [id number]  Recent Labs   Lab 11/22/20  0819 11/21/20  1526   Glucose 110* 177*   Sodium 139 141   Potassium 3.5 3.8   Chloride 107 107   CO2 24 26   BUN 11 17   Creatinine 0.63 0.71   EGFR 75 71   Calcium 8.4* 8.8     Recent Labs   Lab 11/22/20  0819 11/21/20  1526   Magnesium  --  2.0   Albumin 2.8* 3.0*   Protein, Total 6.0 6.7   Bilirubin, Total 1.7* 1.3*   Alkaline Phosphatase 97 121   ALT 12 10   AST (SGOT) 17 18     Recent Labs   Lab 11/21/20  1526   Urine Specific Gravity 1.022   pH, Urine 6.5   Protein, UR 20*   Glucose, UA Negative    Ketones UA Negative   Bilirubin, UA Negative   Blood, UA 0.2*   Nitrite, UA Negative   Urobilinogen, UA 6.0*   Leukocyte Esterase, UA Negative   WBC, UA 0   RBC, UA 6*      Patient Lines/Drains/Airways Status      Active PICC Line / CVC Line / PIV Line / Drain / Airway / Intraosseous Line / Epidural Line / ART Line / Line / Wound / Pressure Ulcer / NG/OG Tube       Name:   Placement date:   Placement time:   Site:   Days:    Peripheral IV 03/04/20 22 G Anterior;Right Forearm   03/04/20    1530    Forearm   less than 1    Urethral Catheter Straight-tip 16 Fr.   03/01/20    1029    Straight-tip   4                   No results found.   Home Health Needs:  There are no questions and answers to display.       Nutrition assessment done in collaboration with Registered Dietitians:     Time spent: 25 minutes     Jory Ee, MD     11/25/20,8:00 AM   MRN: [id number]                                      CSN: [id number] DOB: 25-Jan-1920

## 2020-11-25 NOTE — Progress Notes (Signed)
Pt seen, examined, counseled.  Care reviewed with team.  Agree with findings and plans as documented by Dr. Daphine Deutscher  with annotations below.   Patient continues to have significant pain in her left foot.  Pain seems localized to the dorsum.  She is tender over the mid dorsum of her foot.  There is moderate swelling and purple discoloration.  On review of x-ray there may be a fracture at the base of the second metatarsal.  We will review with radiology and consider further imaging.  Otherwise patient appears stable.  She continues to states she will not live long-term in a nursing facility but would accept short-term placement for rehabilitation.  Marthenia Rolling, who has assisted her with care over the past few years and it is in a significant relation ship with the patient's POA her nephew Jashawna Reever, was present during my visit.  We reviewed plans.  I agree that the patient is alert conversant and understands her medical care and is competent to make medical decisions.  Tamala Julian, MD

## 2020-11-26 ENCOUNTER — Encounter: Payer: Self-pay | Admitting: Family Medicine

## 2020-11-26 ENCOUNTER — Other Ambulatory Visit (RURAL_HEALTH_CENTER): Payer: Self-pay | Admitting: Student in an Organized Health Care Education/Training Program

## 2020-11-26 DIAGNOSIS — S92902A Unspecified fracture of left foot, initial encounter for closed fracture: Secondary | ICD-10-CM | POA: Diagnosis present

## 2020-11-26 DIAGNOSIS — S92902D Unspecified fracture of left foot, subsequent encounter for fracture with routine healing: Secondary | ICD-10-CM

## 2020-11-26 LAB — VH XPERT XPRESS © SARS-COV-2 PLUS
Does patient have symptoms related to condition of interest?: NEGATIVE
Does patient reside in a congregate care setting?: NEGATIVE
Is patient employed in a healthcare setting?: NEGATIVE
Is the patient hospitalized because of this condition?: NEGATIVE
Is the patient pregnant?: NEGATIVE
SARS-CoV-2 RNA: NEGATIVE

## 2020-11-26 NOTE — Progress Notes (Signed)
Patient hospitalized 2/10 through 11/26/2020 due to age-related debility and Lisfranc fracture of the left foot.  Discussed with Ortho while hospitalized.  Should have outpatient follow-up with podiatry in 3 to 4 weeks.  Will place referral.

## 2020-11-26 NOTE — Progress Notes (Signed)
[facility]  [address] [place], [place] [address]      Discharge Note         11/26/20 1142   CM Review   Utilization Review Status Observation Review Complete   CM Comments 2/15 - SW - Fall - Debility - Fracture of Foot - Discharge to [facility] today - VMT wheelchair  2pm - See detailed note for call report number            11/26/20 1143   Discharge Disposition   Patient preference/choice provided? Yes   Physical Discharge Disposition SNF   Receiving facility, unit and room number: [facility] room 41   Nursing report phone number: [phone]   Facility notified of return Yes   Date facility was notified 11/26/20   Mode of Transportation Wheelchair [employer] up time 2pm [employer] is aware of and in agreement with cost of transport   Patient/Family/POA notified of transfer plan Yes   Patient agreeable to discharge plan/expected d/c date? Yes   Family/POA agreeable to discharge plan/expected d/c date? Yes   Bedside nurse notified of transport plan? Yes   Hard copy of narcotic RX sent with patient? N/A   Hard copy of DNR/Advance Directive sent with patient? No   Advanced directive/code status rescreen completed before discharge?  No   IV antibiotics post discharge? No   Wound care post discharge? No   CM Interventions   Follow up appointment scheduled? No   Reason no follow up scheduled? Discharge to SNF/AR/LTAC   Medicare Checklist   Is this a Medicare patient? Yes   Patient received 1st IMM Letter? Yes   3 midnight inpatient qualifying stay (SNF only) Yes   If LOS 3 days or greater, did patient received 2nd IMM Letter? Yes   Medicaid/DMAS   Medicaid Pre-Screening completed Yes   DMAS 95 MI/MR completed and faxed Yes   DMAS 95 MI/MR trigger Level 2 Screening? No         Clois Comber. Darroll Bredeson, BSW  Social Worker  [facility]

## 2020-11-26 NOTE — PT Progress Note (Signed)
[facility]  [address]  [place], [place] [address]    Department of Rehabilitation  [phone]    Physical Therapy Treatment Note    Selma Mink    CSN: [id number]    [facility] ACUTE CARE   308/308-A    Time of treatment:   Time Calculation  PT Received On: 11/26/20  Start Time: 1051  Stop Time: 1112  Time Calculation (min): 21 min    Visit#: 3    Medical Diagnosis/Pertinent medical/surgical details: Arnetta Odeh was admitted 11/21/2020 with extreme age-related physical debility. Pertinent PMH includes: frequent falls, L foot contusion, HTN, LE edema, debility, OA, osteopenia, restless leg, and anemia.    Precautions and Contraindications:  Aspiration precautions  Falls  Mobility protocol   Weight bearing with multi podus boot when OOB    Assessment:   Patients progress towards established goals: patient is making good progress towards goals. Patient was better able to ambulate with the use of multi podus boot. Pt was able to perform multiple sit-stand transfers with front wheeled walker, minimal assistance and gait belt for safety and able to walk 3x11ft in room with contact guard assistance.  Discharge recommendations SNF for rehab to maximize safety with functional mobility for eventual discharge to home environment.     Patient continues to have the following impairments: decreased ROM, decreased strength, decreased safety/judgement during functional mobility, decreased activity tolerance, impaired motor control , decreased functional mobility, decreased balance, gait deficits, pain    Patient will continue to benefit from skilled PT services in order to maximize independence with functional mobility and regain strength and endurance.        Goals:  By the time of discharge:  -Patient will be able to perform supine<>sit with SBA assist for increased independence with bed mobility ONGOING-patient reports she has been sleeping in  recliner  -Patient will be able to perform stand pivot transfer with SBA assist and FWW to improve safe functional mobility ONGOING  -Patient will be able to ambulate 50 feet with FWW assist and contact guard for improved household mobility ONGOING-patient able to tolerate 3x10 ft with front wheeled walker today  -Patient will be able to participate in at least 5 seated LE exercises to improve LE strength and tolerance to activities. GOAL MET     Plan:   Treatment/interventions: Exercise, Gait training, Stair training, Neuromuscular re-education, Functional transfer training, LE strengthening/ROM, Cognitive reorientation, Patient/caregiver training, Equipment eval/education, Bed mobility, ice pack application    Treatment Frequency: 3-4x/wk    DISCHARGE RECOMMENDATIONS   DME recommended for Discharge:   TBD at next level of care     Discharge Recommendations:   SNF         Subjective:   "I can walk a little."     Patient is agreeable to participation in the therapy session. Nursing clears patient for therapy.     Pain:  Pain at rest: No fascial indications of pain, pt no reports of pain.   Location: N/A  Interventions: None required   Pain after intervention: N/A  Pain scale used: WONG-FACES    OBJECTIVE:   Observation of Patient/Vital Signs:   Patient is seated in a bedside chair with telemetry in place.    Patients medical condition is appropriate for Physical therapy intervention at this time.    Vital Signs:  BP Sitting: 137/79 mmHg  HR Sitting:  100 bpm  SpO2 sitting: 97% on room air  Edema: pain has not pitting edmea from knee to ankle, did not test foot due to pain  Skin Inspection: not able to visualize brusing due to pt wearing socks and not wishing to take them off    Command following: Follows ALL commands and directions without difficulty  Alertness/Arousal: Appropriate responses to stimuli   Attention Span:Appears intact  Memory: Appears intact  Safety Awareness: minimal verbal instruction  Insights:  Decreased awareness of deficits  Problem Solving: Assistance required to implement solutions    Musculoskeletal and Balance Details:   Balance:  Static Sitting:  Good  Dynamic Sitting:  Good  Static Standing:  Good  Dynamic Standing:  Fair+           Functional Mobility:   Bed Mobility:   Not Tested due to patient sitting up in beside chair    Transfers:  Sit to Stand:  Minimal assist with Front wheeled walker, Gait belt, multi podus boot.    Cues for Sequencing, Cues for Hand Placement  Stand to Sit:  Minimal assist.    Cues for Sequencing, Cues for Hand Placement, cues for eccentric control to decrease plopping into chair for safety    Locomotion:  LEVEL AMBULATION:  Distance: 3x69ft in room   Assistance level:  contact guard on gait belt  Device:  Front wheeled walker, Gait belt, multipodus boot  Pattern:  Step through, equal weight bearing on L foot, forward bent at waist, pt unable to correct, family member reports this is standard for pt  Distance limited by: pt fatigue    Participation and Activity Tolerance   Participation effort: Good    Activity Tolerance: Tolerates 30 minutes of activity with rest breaks  Other Treatment Interventions this session:   Therapeutic activity: cues to control sitting down, no plopping.    Education Provided:   TOPICS: role of physical therapy, plan of care, goals of therapy and safety with mobility and ADLs, benefits of activity, use of adaptive equipment, that patient needs to wear multipodus boot with any weight bearing  Learner educated: Patient, Family, niece present  Method: Explanation  Response to education: Verbalized understanding    Patient Position at End of Treatment:   Sitting, in a chair, in the room, Family/visitors present, Needs in reach, Bed/chair alarm set and No distress    Team Communication:   Spoke to : RN/LPN - Razan  Regarding: Pre-session re: patient status, Patient position at end of session, Patient participation with Therapy, multipodus boot  application  Whiteboard updated: Yes, PT updated communication board to reflect current mobility level (PMP 6) with recommendations to contact guard-min A with FWW to stand and pivot from bed to chair with multipodus boot on.    PT/PTA communication: via written note and verbal communication as needed.      Oletta Darter, PT, DPT

## 2020-11-26 NOTE — Discharge Summary (Signed)
Medicine Discharge Summary   [facility]  [facility]   Patient Name: Carla Cannon   Attending Physician: Judi Cong, DO PCP: Gershon Crane, FNP   Date of Admission: 11/21/2020 D/C Date: 11/26/2020   Discharge Diagnoses:   Lisfranc fracture L foot  Discussed w/ ortho 2/14, Dr. Rolly Salter.  Not a good surgical candidate.  Recommend brace.  If brace not tolerated, should be non weight bearing till follow up.  Follow up w/ ortho in 3-4 wks.  Referral placed for outpatient podiatry follow-up    Physical debility  SNF as per PT recommendations    Restless leg  Previously on ropinirole  No longer taking medication    Dementia, age-related  A&O x 3.  Has insight into situation and does have capacity to make decisions     Hospital Course       Carla Cannon is a 87 y.o. female patient that was admitted on 11/21/2020 after having suffered multiple falls during the week prior to admission.  Patient was living alone and with increasing debility, no longer able to care for herself.  On admission, there was pain and swelling of left foot, but x-ray negative for fracture.  Due to persistence of symptoms, images reviewed on 2/14, and discussed with radiology and orthopedics.  Patient does have a Lisfranc fracture.  Orthopedics recommended a brace and outpatient follow-up.  PT has recommended SNF.  Patient preferred to go home, but was amenable to a trial of SNF    Principal Problem:    Age-related physical debility  Active Problems:    History of recent fall    Foot fracture, left  Resolved Problems:    Contusion of left foot     Pending Results and other significant studies:  None     Discharge Instructions:          Disposition: SNF  Diet: Regular Diet  Activity: Per Physicial Therapy  Discharge Code Status: NO CPR  -  ALLOW NATURAL DEATH    Podiatry Office    In 3 weeks      [place] FNP  [facility]  [address] [place]  [place] [address]  [phone]    In 2 weeks         Discharge Medications:                                                                        Discharge Medication List      Taking    nystatin powder  Dose: 1 application  Generic drug: nystatin  1 application daily.         STOP taking these medications    aspirin EC 81 MG EC tablet     gabapentin 100 MG capsule  Commonly known as: NEURONTIN     lisinopril-hydroCHLOROthiazide 20-12.5 MG per tablet  Commonly known as: ZESTORETIC     pramipexole 0.5 MG tablet  Commonly known as: MIRAPEX     rOPINIRole 0.5 MG tablet  Commonly known as: REQUIP     triamcinolone 0.1 % cream  Commonly known as: KENALOG             Discharge  Day Exam (11/26/2020):     Blood pressure 156/87, pulse 98, temperature 97.9 F (36.6 C), temperature source Temporal, resp. rate 17, height 1.549 m (5\' 1" ), weight 72.3 kg (159 lb 6.3 oz), SpO2 97 %.      Physical Exam  Constitutional:       General: She is not in acute distress.  Cardiovascular:      Rate and Rhythm: Normal rate and regular rhythm.      Heart sounds: Normal heart sounds. No murmur heard.      Pulmonary:      Effort: Pulmonary effort is normal. No respiratory distress.      Breath sounds: Normal breath sounds. No wheezing.   Abdominal:      General: There is no distension.      Palpations: Abdomen is soft.      Tenderness: There is no abdominal tenderness. There is no guarding.   Musculoskeletal:      Comments: Bruising and swelling of dorsum of left foot.  Less bruising on plantar aspect of left foot.  Some edema, but sensation and cap refill intact distally   Skin:     General: Skin is warm and dry.   Neurological:      Mental Status: She is alert and oriented to person, place, and time.   Psychiatric:         Mood and Affect: Mood normal.            Recent Labs      Recent Labs   Lab 11/22/20  0819 11/21/20  1526   WBC 6.2 8.5   RBC 4.31 4.38   Hemoglobin 12.1 12.8   Hematocrit 39.2 40.1   MCV 91 92   PLT CT 247 253         Recent Labs    Lab 11/21/20  1526   Troponin I 0.02   Creatine Kinase (CK) 125     No results found for: [id number]  Recent Labs   Lab 11/22/20  0819 11/21/20  1526   Glucose 110* 177*   Sodium 139 141   Potassium 3.5 3.8   Chloride 107 107   CO2 24 26   BUN 11 17   Creatinine 0.63 0.71   EGFR 75 71   Calcium 8.4* 8.8     Recent Labs   Lab 11/22/20  0819 11/21/20  1526   Magnesium  --  2.0   Albumin 2.8* 3.0*   Protein, Total 6.0 6.7   Bilirubin, Total 1.7* 1.3*   Alkaline Phosphatase 97 121   ALT 12 10   AST (SGOT) 17 18        Allergies:      Sulfamethoxazole-trimethoprim, Cephalosporins, Ciprocin-fluocin-procin [fluocinolone], Ciprofloxacin, Codeine, Other, Penicillins, Sertraline, Varicella virus vaccine live, and Ketoconazole   Time spent on discharging the patient:  40 minutes   XR CHEST AP PORTABLE    Result Date: 11/25/2020  No acute abnormality. ReadingStation:[id number]     Home Health Needs:  There are no questions and answers to display.      Jory Ee, MD         11/26/20 10:59 AM   MRN: [id number]                                      CSN: [id number] DOB: [birth year]

## 2020-11-26 NOTE — SLP Progress Note (Signed)
[facility]  Speech Language Pathology   Daily Progress Note    Patient: Carla Cannon     CSN#: [id number]   [facility]ARE 308/308-A    ASSESSMENT/PLAN/RECOMMENDATIONS:     SLP diagnosis:  Functional oropharyngeal swallow complicated by lack of dentition.    Diet recommendations:  Easy to Chew diet, Thin liquids, No restrictions; patient may take meds per their preference/tolerance    Projected Discharge Recommendations:  No further SLP needs    Prognosis:  Good     Continue therapy for: D/C; No further SLP needs at this time, All goals met   x Discontinue therapy      Goals:  Pt will:  STG:  (In 3-4 days)  1. Pt will tolerate a minced and moist diet with thin liquids free of signs/symptoms of aspiration. (MET and ADVANCED TO EASY TO CHEW)  2. Pt/staff education (MET)  3. Pt will use compensatory swallow strategies of Take small bites/sips, Eat/feed slowly during meals given mod assist (MET)    LTG: (By discharge)  1. Pt will tolerate the least restrictive diet free of signs/symptoms of aspiration.  (MET)      SUBJECTIVE:      Subjective: Patient is agreeable to participation in the therapy session. Family and/or guardian are agreeable to patient's participation in the therapy session. Nursing clears patient for therapy. Patients medical condition is appropriate for Speech therapy intervention at this time.    S:  "I'm fine" Pt was alert and oriented to person and place.   Niece present in room, mentioned that pt does not wear dentures at home but tolerates a regular diet.       OBJECTIVE:     O: SLP Treatment:  Pt was seen for SLP tx with progress as follows:  Pt was seated upright in bedside chair. Pt does not have her dentures with her at the hospital. Pt's niece brought her a jelly filled donut and coffee. Pt was cued to take small bites and to take a sip of liquid after a few bites of food. Pt ate the donut free of overt s/s of aspiration. Pt consumed cup sip thins  with no overt s/s aspiration but exhibited belching. Pt then trialed x1 bite of a cracker free of overt s/s of aspiration. Pt had prolonged, but functional mastication. Pt's niece reports that she eats a pretty regular diet at home, but does not eat hard foods such as nuts and popcorn. Recommend an EASY TO CHEW diet and THIN liquids.       Pt/Family/Caregiver Education Provided:     Patient, family was/were educated re: role of slp, compensatory swallowing strategies (Alternate liquids and solids, Take small bites/sips), results/recommendations of today's evaluation  Good understanding was verbalized/demonstrated: yes  Comprehension limited by: None    Team Communication:     Spoke to : RN/LPN - Razan  Regarding: results/recommendations of today's evaluation  Good understanding was verbalized: yes                          Time of treatment:   SLP Received On: (P) 11/26/20  Start Time: (P) 1032  Stop Time: (P) 1050  Time Calculation (min): (P) 18 min    SLP Visit Number: (P) 2      Completed by:  Riesa Pope  SLP Graduate Clinician    ______________________________________________________________________    I have reviewed and agree with the documentation  authored by my student of the evaluation/ treatment/education/care plan delivered during my direct supervision.     Co-Signed by:  Doristine Mango, SLP  ______________________________________________________________________

## 2020-11-26 NOTE — Plan of Care (Signed)
Problem: Pain interferes with ability to perform ADL  Goal: Pain at adequate level as identified by patient  Outcome: Progressing  Flowsheets (Taken 11/22/2020 1030 by Colman Cater, RN)  Pain at adequate level as identified by patient:   Identify patient comfort function goal   Assess for risk of opioid induced respiratory depression, including snoring/sleep apnea. Alert healthcare team of risk factors identified.   Assess pain on admission, during daily assessment and/or before any "as needed" intervention(s)   Reassess pain within 30-60 minutes of any procedure/intervention, per Pain Assessment, Intervention, Reassessment (AIR) Cycle   Evaluate if patient comfort function goal is met   Evaluate patient's satisfaction with pain management progress

## 2020-11-26 NOTE — OT Progress Note (Signed)
[facility]  [address]  [place], [place] [address]    Department of Rehabilitation  [phone]    Occupational Therapy Progress Note    Carla Cannon    CSN#: [id number]  [facility]ARE 308/308-A    Patients medical condition is appropriate for Occupational therapy intervention at this time.    Time of treatment:   Time Calculation  OT Received On: 11/26/20  Start Time: 0944  Stop Time: 1014  Time Calculation (min): 30 min  Total Treatment Time (min): 30    Visit#: 2    Medical Diagnosis/Pertinent medical/surgical details: Carla Cannon is a 87 y.o. female admitted 11/21/2020 with age-related physical debility. Pt has had several recent falls resulting in left ankle injury (imaging revealed no fracture when arriving in the ED). Co-morbidities impacting tx include HTN, restless leg syndrome, age-related dementia, OA, cataract extraction, and previous left wrist fracture.    Precautions and Contraindications:   Wear left multipodus boot when out of bed  Aspiration precautions  Falls  Diet: Minced and moist, thin liquids    Assessment:   Patients progress towards established goals: Pt is making great progress towards goals as evidenced by meeting all initial established goals. Pt was able to complete stand-pivot transfer with CGA using FWW and multipodus boot. Pt able to complete toileting and clothing management up/down with no more than CGA this session. Despite pt's progress, pt is still most appropriate for SNF at discharge.     Patient continues to have the following impairments:decreased strength, balance deficits, edema, decreased independence with ADLs, decreased independence with IADLs, decreased activity tolerance, decrease safety awareness, decreased cognition, decreased problem solving, decreased functional mobility, decreased functional transfers    Patient will continue to benefit from skilled OT services in order to maximize  safety and independence with ADLs, functional mobility/transfers, and use of compensatory strategies, DME prn      Goals: (timeframe: by d/c)  1. Pt will participate in clothing management up/down hips with no more than mod A using AE/AT prn to max independence and safety during LB dressing. =MET  2. Pt will participate in toileting with no more than mod A using AE/AT prn to max independence with toileting tasks.=MET  3. Pt will participate in functional transfer to chair/BSC/toilet with no more than min Ax1 using AD/AT prn to promote independence with out of bed activities.=MET  4. Pt will participate in standing for 1 minute with no more than min A using AE/AT prn to max activity tolerance in prep for LB tasks. =MET  5. Pt will complete functional mobility to/from bathroom with no more than CGA using AE/AT prn to max independence with bathroom level ADLs.=NEW  6. Pt will participate in threading pants over bilateral feet to knee with no more than SBA using AE/AT prn to max independence with LB dressing.= NEW    Plan:   Treatment/interventions: ADL retraining, functional transfer training, UE strengthening/ROM, activity pacing, cognition (safety awareness, problem solving, etc.), patient/family training, equipment eval/education, compensatory technique education    Treatment Frequency: OT Frequency Recommended: 4-5x/wk     DISCHARGE RECOMMENDATIONS   DME recommended for Discharge:   TBD at next level of care    Discharge Recommendations:   SNF         Subjective:   "Yea I am not in pain. I like this boot."  Patient is agreeable to participation in the therapy session.     Pain:  At Rest: 0 /10  With Activity: 2/10  Location: Foot: left  Interventions: Repositioned, Rest  Pain scale used: Faces    Objective:   Observation of Patient:    Patient is seated in a recliner chair with Bed/chair alarm on, on room air in place.    Edema: mild edema present through dorsal aspect of left foot  Skin Inspection: bruising noted  including purple bruising along dorsal aspect of foot below 3-5 toes    Vital Signs:   BP at rest: 145/80 mmHg  HR at rest: 88 bpm  HR after activity: 89 bpm  SpO2 at rest: 97% on room air   SpO2 with activity: 98% on room air  BP taken: LUE     Command following: Follows multi-step commands with repetition  Safety Awareness: minimal verbal instruction  Insights: Decreased awareness of deficits  Behavior: Cooperative         Activities of Daily Living:   LB Dressing: CGA; pull up over hips, pull down from hips performed  standing with  Front wheeled walker, Gait belt  TOILETING:  CGA for clothing management up, clothing management down, perineal hygiene; bedside commode     Functional Mobility:   Bed Mobility:  Not tested due to patient already OOB.    Transfers:  Sit to Stand:  CGA with Front wheeled walker, Gait belt.   Cues for Sequencing  Stand to Sit:  Minimal assist.   Comments: cues for controlled lowering as pt was observed to "plop"  Stand Pivot:   CGA with Front wheeled walker, Gait belt.   Cues for Walker Management    Participation and Activity Tolerance   Participation effort: Good  Activity Tolerance: Tolerates 30 minutes of activity with rest breaks    Other Treatment Interventions:   Treatment Activities:   Not applicable           Education Provided:   Topics: Role of occupational therapy, plan of care, goals of therapy and safety with mobility and ADLs, benefits of activity, use of adaptive equipment.    Individuals educated: Patient.  Method: Explanation.  Response to education: Verbalized understanding and Needs reinforcement.    Team Communication:   OT communicated with: SLP - Juhi  OT communicated regarding: Pre-session re: patient status  OT/COTA communication: via written note and verbal communication as needed.    Sitting, in a chair, Needs in reach, Bed/chair alarm set, No distress and on room air    Recommend client sit OOB for all meals outside of and in addition to OT  session.    Raleigh Lions, OT, MOT, OTR/L

## 2020-11-26 NOTE — Discharge Instr - AVS First Page (Addendum)
Diet recommendations:  Easy to Chew diet, Thin liquids, No restrictions; patient may take meds per their preference/tolerance       Patient will require follow up with Podiatry in 3-4 weeks - PCP office [facility] is placing referral to their referral desk

## 2020-11-29 ENCOUNTER — Inpatient Hospital Stay (RURAL_HEALTH_CENTER): Payer: Self-pay | Admitting: Family Medicine

## 2020-12-04 ENCOUNTER — Inpatient Hospital Stay (RURAL_HEALTH_CENTER): Payer: Self-pay | Admitting: Family Medicine

## 2020-12-12 ENCOUNTER — Telehealth (RURAL_HEALTH_CENTER): Payer: Self-pay

## 2020-12-12 NOTE — Telephone Encounter (Signed)
Debarah Crape would like a call to discuss a doctor driven authorization for medicare for assisted living.

## 2020-12-16 NOTE — Telephone Encounter (Signed)
Spoke with Debarah Crape who is her POA.  Patient currently in [facility] and unable to go home on her own and needs 24 hr care.   Agreed to provide any records that SS might need for help move her to LTC>  Recommended a hospice consult while she is in SNF as she is not progressing and quite debilitated.  Debarah Crape will be back in touch if there is any documentation we have that she may need.   Gershon Crane, FNP

## 2020-12-27 ENCOUNTER — Encounter (RURAL_HEALTH_CENTER): Payer: Self-pay | Admitting: Family Medicine

## 2020-12-27 ENCOUNTER — Ambulatory Visit: Payer: Medicare Other | Attending: Family Medicine | Admitting: Family Medicine

## 2020-12-27 DIAGNOSIS — L03116 Cellulitis of left lower limb: Secondary | ICD-10-CM

## 2020-12-27 MED ORDER — DOXYCYCLINE MONOHYDRATE 100 MG PO TABS
100.0000 mg | ORAL_TABLET | Freq: Two times a day (BID) | ORAL | 0 refills | Status: AC
Start: 2020-12-27 — End: 2021-01-03

## 2020-12-27 NOTE — Progress Notes (Signed)
[facility]  [address] [facility] [address]  [place], [place]  Phone [phone]   Fax [phone]    12/31/2020     Patient:   Carla Cannon                                                                                                             DOB:       07-Oct-1920                                        Provider Name:  Judi Cong, DO     SUBJECTIVE     History was provided by the patient.    HPI:  Carla Cannon is a 87 y.o. adult who is being seen today for telehealth visit.  Niece, Carla Cannon, is giving history.    The patient was recently discharged home from Rehab after a fall that resulted in a nonoperapable L foot fracture. PT came to the house and said he couldn't do treatment, because she didn't have a boot on it.     She reports the L foot is open, oozing, warm to touch and red. It is also swollen, but she doesn't think the swelling is new. No fevers or chills.    Carla Cannon is currently staying with her and has a meeting an attorney on Tuesday to determine help with selling assets in order to get her placed in a living faciltiy.      ROS:  Review of Systems   Constitutional: Negative for chills and fever.   Skin: Positive for color change, rash and wound.       Patient Active Problem List   Diagnosis    Benign essential hypertension    Idiopathic urticaria    Osteoarthritis    Osteopenia    Pulmonary hypertension, unspecified    Restless leg    Age-related physical debility    Frequent falls    History of recent fall    Foot fracture, left      Social History     Tobacco Use   Smoking Status Never Smoker   Smokeless Tobacco Never Used      Social History     Substance and Sexual Activity   Alcohol Use No      Allergies   Allergen Reactions    Sulfamethoxazole-Trimethoprim      patient fell after taking this med and felt  wealk    Cephalosporins Photosensitivity    Ciprocin-Fluocin-Procin [Fluocinolone] Photosensitivity    Ciprofloxacin     Codeine Nausea Only    Other      Nausea    Penicillins Nausea Only    Sertraline      Nausea    Varicella Virus Vaccine Live      rash    Ketoconazole Rash     Current Outpatient Medications   Medication Instructions  doxycycline (ADOXA) 100 mg, Oral, Every 12 hours scheduled          PHYSICAL EXAM     Vital Signs:  There were no vitals taken for this visit.  Physical Exam  Vitals and nursing note reviewed.   Constitutional:       Appearance: Normal appearance.   Pulmonary:      Effort: Pulmonary effort is normal.   Skin:     Comments: Entire L dorsal aspect of foot was erythematous with 3 open draining areas. Drainage was clear/yellow. No extension into the shin.   Neurological:      Mental Status: She is alert.   Psychiatric:         Mood and Affect: Mood normal.     Limited due to telehealth    Recent Labs:    Radiology Results:  No results found.    ASSESSMENT and PLAN       Problem List Items Addressed This Visit    None     Visit Diagnoses     Cellulitis of left lower extremity    -  Primary    Tx with abx. Gave warning precautions on when to return to the hospital/ER. Has fu with Ortho.           Follow up  Return if symptoms worsen or fail to improve.     Judi Cong, DO

## 2020-12-27 NOTE — Progress Notes (Signed)
I verified that I have the correct patient on the line by verifying that the first & last name and DOB match our records.   I advise patient that by clicking the link that is sent to them they are consenting to be treated by the provider and for our office to bill their insurance and that the patient will be responsible for anything their insurance does not cover and or their copay. This is occurring during the COVID 19 pandemic emergency  I informed patient that this visit is going to be conducted through a real time audio and video platform and is not being recorded.  Services are being rendered at the patient's home while our clinical staff is at [address] [facility].   I advised patient that if there is anyone present at this visit that they may be exposed to their protected health information.   Patient express understanding and agreed to proceed with the call and treatment if necessary.  [place], [place]   Pharmacy confirmed as [facility]     patient would like link sent via [phone]    Any vital signs were obtained with the patient's own devices.

## 2021-01-01 ENCOUNTER — Telehealth (RURAL_HEALTH_CENTER): Payer: Self-pay

## 2021-01-01 NOTE — Telephone Encounter (Signed)
Beaulah Dinning, Child psychotherapist with [facility], LM on [facility] asking for a call back from Mrs [place] regarding facility placement for pt.  Can be reached at [phone].

## 2021-01-02 ENCOUNTER — Ambulatory Visit: Payer: Medicare Other | Attending: Orthopaedic Surgery | Admitting: Orthopaedic Surgery

## 2021-01-02 ENCOUNTER — Encounter (RURAL_HEALTH_CENTER): Payer: Self-pay | Admitting: Orthopaedic Surgery

## 2021-01-02 DIAGNOSIS — S92902A Unspecified fracture of left foot, initial encounter for closed fracture: Secondary | ICD-10-CM

## 2021-01-02 DIAGNOSIS — L97421 Non-pressure chronic ulcer of left heel and midfoot limited to breakdown of skin: Secondary | ICD-10-CM

## 2021-01-02 DIAGNOSIS — Z8781 Personal history of (healed) traumatic fracture: Secondary | ICD-10-CM

## 2021-01-02 NOTE — Patient Instructions (Signed)
PRESCRIPTION MEDICATION GUIDELINES     This practice treats injury and disease by surgery, casting, or supervised physical therapy.  Prescription medications only supplement these aspects of your care.                1.  Medications are only to be used by the person for whom they are prescribed and only for the purpose for which they are prescribed.     2.  All medications should be used only in the doses prescribed and no more frequently than the prescribed schedule.  Early refills will not be given if you are not taking medication as prescribed.     3.  Lost or stolen prescriptions or pills will not be replaced, so keep your medication in a safe and secure location.     4.  Prescriptions are designed to last until your next scheduled appointment.  Confirm any planned trips or other scheduling considerations before leaving the examination room so that the correct number of doses can be prescribed.  Prescriptions are only provided on scheduled appointments.     5.  You are required to inform every doctor that you see about all medications prescribed by any other doctor, especially narcotics.  If you are receiving narcotics of any kind from this practice, you should not be receiving them from any other provider.     6.  If, after a brief trial of medication, we determine that your problem is best managed by medicine alone, you will be asked to secure long term medical management from your medical physician.  This is a surgical practice.  Narcotics will be given for short term use only.  NO EXCEPTIONS.     7.  Failure to give these medications their proper respect and abide by these principles will result in the complete discontinuation of prescription medications for you from this practice.     8.  All narcotic use is monitored through the [place] Prescription Monitoring Program.     9.  Phone, fax, or email prescriptions will not be offered in place of scheduled appointments.  We do not respond to requests for  medication by phone, fax, or email.     Failure to comply with the above terms can or will result in dismissal from the practice.     Chronic pain is best managed and coordinated by primary care providers or a pain specialist.     [place] Law only allows a short course of pain medication.  Generally, most patients require no more than a seven day supply.     Drug Use/Abuse Resources     For more information on drug treatment services in your area, call 2-1-1 or visit www.211.org and enter your zip code.     Concern Hotline offers local crisis intervention and referral.  Calls are answered 24/7.                 [phone]  ([place], [place], [place])              [phone]  ([place])              [phone]  ([place])              [phone]  ([place]     For suggestions on safe drug storage, visit www.fda.gov/lockitup.     For drug information geared to teens, parents, and health professionals, visit www.drugabuse.gov.     If you were given a referral   today, all office notes and insurance information will be sent from us to the office you are being referred.  Please give the office you are being sent to 5-7 business days to get in touch with you to schedule the appointment.   If after 5-7 business days, you have not heard from the office you were referred, please contact them directly.   If the appointment is still not made after 2 weeks, please do not hesitate to contact us for assistance.

## 2021-01-02 NOTE — Telephone Encounter (Signed)
01/02/2021  Called at noon and again at  1620.  No answer.  Left message to call back

## 2021-01-07 NOTE — Telephone Encounter (Signed)
Attempted to call several additional times.  No answer and no call back   Gershon Crane, FNP

## 2021-01-07 NOTE — Progress Notes (Signed)
Progress Note      Patient Name:  Carla Cannon    Chief Complaint   Patient presents with    Foot Injury     Left side       Subjective/HPI Comments:    Patient is a  87 y.o. adult who presents with The encounter diagnosis was Closed fracture of left foot, initial encounter.  Patient reported that she injured left foot resulting in some ulcer formation.  Patient is advised to follow-up with orthopedics and she is here with family.    Outpatient Medications Marked as Taking for the 01/02/21 encounter (Office Visit) with Eustaquio Boyden, Roderick Calo R, DO   Medication Sig Dispense Refill    acetaminophen (TYLENOL) 325 MG tablet Take 650 mg by mouth      [EXPIRED] doxycycline (ADOXA) 100 MG tablet Take 1 tablet (100 mg total) by mouth every 12 (twelve) hours for 7 days 14 tablet 0     Allergies   Allergen Reactions    Sulfamethoxazole-Trimethoprim      patient fell after taking this med and felt wealk    Cephalosporins Photosensitivity    Ciprocin-Fluocin-Procin [Fluocinolone] Photosensitivity    Ciprofloxacin     Codeine Nausea Only    Other      Nausea    Penicillins Nausea Only    Sertraline      Nausea    Varicella Virus Vaccine Live      rash    Ketoconazole Rash     Past Medical History:   Diagnosis Date    Age-related physical debility     11/21/20 admission    Anemia     Arthritis     Arthritis     Contusion of left foot, initial encounter     Frequent falls     Hypertension     Osteoarthritis     Osteopenia     Prediabetes     Pulmonary HTN     RLS (restless legs syndrome)      Past Surgical History:   Procedure Laterality Date    CATARACT EXTRACTION      ELBOW SURGERY      HYSTERECTOMY      WRIST FRACTURE SURGERY Left 10.4.2006     Social History     Occupational History    Not on file   Tobacco Use    Smoking status: Never Smoker    Smokeless tobacco: Never Used   Haematologist Use: Never used   Substance and Sexual Activity    Alcohol use: No    Drug use: No    Sexual  activity: Not on file     Family History   Problem Relation Age of Onset    Arthritis Mother        Review of Systems:    Review of Systems   Constitutional: Negative for activity change, fatigue, fever and unexpected weight change.   Musculoskeletal: Positive for arthralgias, gait problem and joint swelling.   Skin: Negative for rash and wound.   Neurological: Negative for weakness, numbness and headaches.   Hematological: Does not bruise/bleed easily.   Psychiatric/Behavioral: Negative for confusion and sleep disturbance. The patient is not nervous/anxious and is not hyperactive.         Objective/ Physical Exam:    There were no vitals taken for this visit.    Physical Exam  Cardiovascular:      Pulses:  Posterior tibial pulses are 1+ on the left side.   Musculoskeletal:      Left foot: Deformity present.        Feet:    Feet:      Left foot:      Skin integrity: Ulcer, skin breakdown and warmth present.      Toenail Condition: Left toenails are abnormally thick.      Comments: Left foot inspection, swelling with open wound noted.  Serous drainage noted from the open wound.  Minimal discomfort with ankle motion.         Imaging Results:      AP, lateral, oblique view of left foot x-ray that was done on 11/17/2020 shows global osteopenia with degenerative arthritis of the MTP joint otherwise no acute fracture noted.    Assessment:    1. Closed fracture of left foot, initial encounter        Plan:    Clinical findings and treatment plan discussed with patient and patient's family.  Patient and patient's family advised that patient has ulceration from previous fracture of her foot.  Patient advised that the fracture actually healing well.  Patient is under wound care.  Continue with wound care and follow-up as needed.      This note was generated by the Epic EMR system/ Dragon speech recognition and may contain inherent errors or omissions not intended by the user. Grammatical errors, random word insertions,  deletions, pronoun errors and incomplete sentences are occasional consequences of this technology due to software limitations. Not all errors are caught or corrected. If there are questions or concerns about the content of this note or information contained within the body of this dictation they should be addressed directly with the author for clarification

## 2021-01-13 ENCOUNTER — Other Ambulatory Visit (RURAL_HEALTH_CENTER): Payer: Self-pay

## 2021-01-13 MED ORDER — NYSTATIN 100000 UNIT/GM EX POWD
1.0000 | Freq: Every day | CUTANEOUS | 1 refills | Status: AC
Start: 2021-01-13 — End: ?

## 2021-01-13 NOTE — Telephone Encounter (Signed)
Carla Cannon from [facility] requesting refill on pending medication.

## 2021-01-15 NOTE — Telephone Encounter (Signed)
I do not know what that form is.  Perhaps she can contact the facility in [place]  and they can send me the form here.  I am happy to fill it out but if I can.  There is usually an admission form that needs to be completed.  Once they send me the form I can then be able to determine if it I can fill it out without having to see her   Gershon Crane, FNP

## 2021-01-15 NOTE — Telephone Encounter (Signed)
Jessica with [facility] VM in regards to pt placement in a facility in [place].  States they are in need of an SLT document being filled out.  States she is unsure what this document is and was hoping you could shed some light on this as it is required for entry into [place] nursing facilities.  Shanda Bumps requesting a call back if possible at [phone].

## 2021-01-16 NOTE — Telephone Encounter (Signed)
LVM to [facility].

## 2021-01-20 ENCOUNTER — Telehealth (RURAL_HEALTH_CENTER): Payer: Self-pay | Admitting: Orthopaedic Surgery

## 2021-01-20 NOTE — Telephone Encounter (Signed)
Hey Dr.Shibeshi,     Jasmine from [facility] called about the patient and stated that the patient is still wearing the brace and that she refuses to really get out of bed to put pressure on left foot. There is a concern for a lack of motivation.     Can you review patient and info and with this new to advise for the Home Health nurse.     Staff: Please call [phone]    Thank you,  Marchelle Folks

## 2021-01-21 NOTE — Telephone Encounter (Signed)
Relayed message to Merill at [facility]

## 2021-01-31 ENCOUNTER — Telehealth (RURAL_HEALTH_CENTER): Payer: Self-pay | Admitting: Family

## 2021-01-31 NOTE — Telephone Encounter (Signed)
Pt's EC, Claudia called requesting advisement for admission requirements for pt to be admitted to long-term care facility in [facility]. Pt's EC stated she has the forms on her phone for intake but doesn't know how to get them to be printed or to [facility]. EC advised to have facility fax forms to [facility] and to have pt seen for admission P.E. so that necessary orders or requirements can be addressed pending receipt of forms. Pt's EC agreeable and transferred to scheduling. Pt scheduled with Dr. Daphine Deutscher, Tuesday, 02/04/2021.

## 2021-02-04 ENCOUNTER — Ambulatory Visit
Admission: RE | Admit: 2021-02-04 | Discharge: 2021-02-04 | Disposition: A | Discharge status: Home / Self Care | Payer: Medicare Other | Source: Ambulatory Visit | Attending: Family Medicine | Admitting: Family Medicine

## 2021-02-04 ENCOUNTER — Other Ambulatory Visit (RURAL_HEALTH_CENTER): Payer: Self-pay | Admitting: Family Medicine

## 2021-02-04 ENCOUNTER — Ambulatory Visit
Payer: Medicare Other | Attending: Student in an Organized Health Care Education/Training Program | Admitting: Student in an Organized Health Care Education/Training Program

## 2021-02-04 ENCOUNTER — Encounter (RURAL_HEALTH_CENTER): Payer: Self-pay | Admitting: Student in an Organized Health Care Education/Training Program

## 2021-02-04 VITALS — BP 120/76 | HR 93 | Resp 15 | Wt 159.0 lb

## 2021-02-04 DIAGNOSIS — Z23 Encounter for immunization: Secondary | ICD-10-CM

## 2021-02-04 DIAGNOSIS — R54 Age-related physical debility: Secondary | ICD-10-CM

## 2021-02-04 DIAGNOSIS — M19011 Primary osteoarthritis, right shoulder: Secondary | ICD-10-CM | POA: Insufficient documentation

## 2021-02-04 DIAGNOSIS — M5134 Other intervertebral disc degeneration, thoracic region: Secondary | ICD-10-CM | POA: Insufficient documentation

## 2021-02-04 DIAGNOSIS — Z111 Encounter for screening for respiratory tuberculosis: Secondary | ICD-10-CM | POA: Insufficient documentation

## 2021-02-04 DIAGNOSIS — K449 Diaphragmatic hernia without obstruction or gangrene: Secondary | ICD-10-CM | POA: Insufficient documentation

## 2021-02-04 DIAGNOSIS — M19012 Primary osteoarthritis, left shoulder: Secondary | ICD-10-CM | POA: Insufficient documentation

## 2021-02-04 NOTE — Progress Notes (Signed)
[facility]  [address] [facility] [address]  [place], [place]  Phone [phone]   Fax [phone]    02/04/2021     Patient:   Carla Cannon                                                                                                             DOB:       01/04/1920                                        Provider Name:  Jory Ee, MD     SUBJECTIVE     HPI  Carla Cannon is a 87 y.o. adult who is being seen today for nursing home intake.  She was hospitalized 11/2020 for left foot trauma and fracture.  She has followed up with orthopedics who commented that the fracture was healing well and there were no further recommendations other than wound care.  Family member with her in office today who lives in [place] and has been traveling frequently over recent years.  For the past 2 months, she has been staying with her constantly and assisting her with essentially all of her ADLs.  They have plans to move patient to [place] to a nursing facility closer to their home and would like to have necessary paperwork filled out today.    Relative notes that she does sometimes seem to have labored breathing at night.  Otherwise she seems to be comfortable.  She does not desire any labs/work-up to investigate this.  She does note that she has been declining in the last 1 to 2 months, progressively weaker, eating less.  Again, not desiring any labs/work-up/interventions.    ROS:  Review of Systems  Per HPI    Patient Active Problem List   Diagnosis   . Osteoarthritis   . Osteopenia   . Pulmonary hypertension, unspecified   . Age-related physical debility   . Frequent falls   . Foot fracture, left      Social History     Tobacco Use   Smoking Status Never Smoker   Smokeless Tobacco Never Used      Social History     Substance and Sexual  Activity   Alcohol Use No      Allergies   Allergen Reactions   . Sulfamethoxazole-Trimethoprim      patient fell after taking this med and felt wealk   . Cephalosporins Photosensitivity   . Ciprocin-Fluocin-Procin [Fluocinolone] Photosensitivity   . Ciprofloxacin    . Codeine Nausea Only   . Other      Nausea   . Penicillins Nausea Only   . Sertraline      Nausea   . Varicella Virus Vaccine Live      rash   . Ketoconazole Rash     Current Outpatient Medications   Medication Instructions   .  acetaminophen (TYLENOL) 650 mg, Oral   . nystatin (nystatin) powder 1 application, Topical, Daily          PHYSICAL EXAM     Physical Exam  Vitals reviewed.   Constitutional:       Comments: Pale, debilitated, in wheelchair, comfortable, interactive   HENT:      Head: Normocephalic.   Eyes:      Conjunctiva/sclera: Conjunctivae normal.   Cardiovascular:      Rate and Rhythm: Normal rate and regular rhythm.      Pulses: Normal pulses.      Heart sounds: No murmur heard.  Pulmonary:      Effort: Pulmonary effort is normal.      Breath sounds: Normal breath sounds. No wheezing or rhonchi.   Abdominal:      Palpations: Abdomen is soft.      Tenderness: There is no abdominal tenderness.   Skin:     General: Skin is warm and dry.      Findings: No rash.   Neurological:      General: No focal deficit present.      Mental Status: She is alert.   Psychiatric:         Mood and Affect: Mood normal.         Behavior: Behavior normal.         Vital Signs:  BP 120/76   Pulse 93   Resp 15   Wt 72.1 kg (159 lb) Comment: with assistance  SpO2 100%   BMI 30.04 kg/m     Recent Labs:  No results found for this or any previous visit (from the past 504 hour(s)).     Radiology Results:  No results found.    ASSESSMENT and PLAN       Problem List Items Addressed This Visit        Other    Age-related physical debility - Primary    Relevant Orders    Pneumococcal polysaccharide vaccine 23-valent greater than or equal to 2yo subcutaneous/IM  (Completed)      Other Visit Diagnoses     Screening-pulmonary TB        Relevant Orders    XR Chest PA and lateral    Quantiferon(R) - TB Gold Plus    Encounter for immunization         Relevant Orders    Pneumococcal polysaccharide vaccine 23-valent greater than or equal to 2yo subcutaneous/IM (Completed)         Planning to move to long-term nursing facility in [place] as soon as they can make arrangements.  It is difficult for them to leave home and requires arranging transport.  Relative with patient today would like to do anything possible to avoid extra trips.  Checking x-ray and quant to [facility] today in hopes that this will facilitate nursing home intake.  Pneumovax 23 Valent administered.  Discussed likely need for COVID-vaccine, which she has not had.  They expressed understanding.    Follow up  Return if symptoms worsen or fail to improve.     Jory Ee, MD

## 2021-02-06 NOTE — Progress Notes (Signed)
I have reviewed the encounter and agree with the documentation by the resident physician.  Keala Drum Denise Keyonda Bickle, MD

## 2021-02-12 LAB — QUANTIFERON(R)-TB GOLD PLUS

## 2021-02-19 ENCOUNTER — Encounter (RURAL_HEALTH_CENTER): Payer: Self-pay

## 2021-09-27 ENCOUNTER — Other Ambulatory Visit: Payer: Self-pay

## 2021-09-27 ENCOUNTER — Emergency Department: Payer: Medicare Other

## 2021-09-27 ENCOUNTER — Emergency Department
Admission: EM | Admit: 2021-09-27 | Discharge: 2021-09-27 | Disposition: A | Discharge status: Home / Self Care | Payer: Medicare Other | Attending: Emergency Medicine | Admitting: Emergency Medicine

## 2021-09-27 ENCOUNTER — Encounter: Payer: Self-pay | Admitting: *Deleted

## 2021-09-27 DIAGNOSIS — I700 Atherosclerosis of aorta: Secondary | ICD-10-CM | POA: Diagnosis not present

## 2021-09-27 DIAGNOSIS — S0101XA Laceration without foreign body of scalp, initial encounter: Secondary | ICD-10-CM | POA: Insufficient documentation

## 2021-09-27 DIAGNOSIS — W19XXXA Unspecified fall, initial encounter: Secondary | ICD-10-CM | POA: Diagnosis not present

## 2021-09-27 DIAGNOSIS — R7303 Prediabetes: Secondary | ICD-10-CM | POA: Insufficient documentation

## 2021-09-27 DIAGNOSIS — S0990XA Unspecified injury of head, initial encounter: Secondary | ICD-10-CM | POA: Diagnosis present

## 2021-09-27 LAB — COMPREHENSIVE METABOLIC PANEL
ALT: 9 U/L (ref 0–44)
AST: 19 U/L (ref 15–41)
Albumin: 3 g/dL — ABNORMAL LOW (ref 3.5–5.0)
Alkaline Phosphatase: 92 U/L (ref 38–126)
Anion gap: 6 (ref 5–15)
BUN: 15 mg/dL (ref 8–23)
CO2: 27 mmol/L (ref 22–32)
Calcium: 8.3 mg/dL — ABNORMAL LOW (ref 8.9–10.3)
Chloride: 106 mmol/L (ref 98–111)
Creatinine, Ser: 0.64 mg/dL (ref 0.44–1.00)
GFR, Estimated: >60 mL/min (ref >60)
Glucose, Bld: 144 mg/dL — ABNORMAL HIGH (ref 70–99)
Potassium: 3.6 mmol/L (ref 3.5–5.1)
Sodium: 139 mmol/L (ref 135–145)
Total Bilirubin: 1.7 mg/dL — ABNORMAL HIGH (ref 0.3–1.2)
Total Protein: 6.1 g/dL — ABNORMAL LOW (ref 6.5–8.1)

## 2021-09-27 LAB — CBC WITH DIFFERENTIAL/PLATELET
Abs Immature Granulocytes: 0.05 10*3/uL (ref 0.00–0.07)
Basophils Absolute: 0 10*3/uL (ref 0.0–0.1)
Basophils Relative: 1 %
Eosinophils Absolute: 0 10*3/uL (ref 0.0–0.5)
Eosinophils Relative: 0 %
HCT: 44.1 % (ref 36.0–46.0)
Hemoglobin: 14 g/dL (ref 12.0–15.0)
Immature Granulocytes: 1 %
Lymphocytes Relative: 13 %
Lymphs Abs: 1.1 10*3/uL (ref 0.7–4.0)
MCH: 29.9 pg (ref 26.0–34.0)
MCHC: 31.7 g/dL (ref 30.0–36.0)
MCV: 94.2 fL (ref 80.0–100.0)
Monocytes Absolute: 0.6 10*3/uL (ref 0.1–1.0)
Monocytes Relative: 7 %
Neutro Abs: 6.9 10*3/uL (ref 1.7–7.7)
Neutrophils Relative %: 78 %
Platelets: 182 10*3/uL (ref 150–400)
RBC: 4.68 MIL/uL (ref 3.87–5.11)
RDW: 14.4 % (ref 11.5–15.5)
WBC: 8.8 10*3/uL (ref 4.0–10.5)
nRBC: 0 % (ref 0.0–0.2)

## 2021-09-27 LAB — CBG MONITORING, ED: Glucose-Capillary: 141 mg/dL — ABNORMAL HIGH (ref 70–99)

## 2021-09-27 LAB — TROPONIN I (HIGH SENSITIVITY)
Troponin I (High Sensitivity): 12 ng/L (ref <18)
Troponin I (High Sensitivity): 12 ng/L (ref <18)

## 2021-09-27 LAB — PROTIME-INR
INR: 1.1 (ref 0.8–1.2)
Prothrombin Time: 14.6 seconds (ref 11.4–15.2)

## 2021-09-27 NOTE — ED Notes (Signed)
Clean wound with saline. Noted nickel size scrap where the bleeding is coming from. EDP made aware. Placed in gown and purawick put in place.

## 2021-09-27 NOTE — ED Notes (Signed)
Family in room aware of patient going back to [facility].

## 2021-09-27 NOTE — ED Notes (Signed)
Patient family in  hallway with her. No complaints of pain or discomfort. Waiting for EMS to transport back to [facility]. Tried to call [facility] to give a report x4 with no answer and no answering machine. Family aware an Research officer, trade union.

## 2021-09-27 NOTE — ED Provider Notes (Signed)
[facility] Provider Note  ____________________________________________   Event Date/Time   First MD Initiated Contact with Patient 09/27/21 [phone]     (approximate)  I have reviewed the triage vital signs and the nursing notes.   HISTORY  Chief Complaint Fall (Patient found in bathroom unresponsives on floor unknown dow time. Noted gash to back of head bleeding a little.)   HPI Gloria Reyes is a 87 y.o. female who past medical history of arthritis, pulmonary pretension, prediabetes and history of falls who presents via EMS from [facility] for assessment after patient was found on the floor with concerns for unwitnessed fall.  She was noted by staff and EMS have a wound on the back of her head.  Per EMS patient unable to write any additional history secondary to altered mental status and is unclear how far off baseline this is today as apparently staff at facility was unable to say either.  On arrival patient is able to state her name and that she is not in pain but does not report any additional history.  I attempted to reach her son x1 but was unable to do so.         History reviewed. No pertinent past medical history.  There are no problems to display for this patient.   Past Surgical History:  Procedure Laterality Date   ABDOMINAL HYSTERECTOMY      Prior to Admission medications   Not on File    Allergies Patient has no known allergies.  History reviewed. No pertinent family history.  Social History Social History   Tobacco Use   Smoking status: Never   Smokeless tobacco: Never  Substance Use Topics   Alcohol use: Never   Drug use: Never    Review of Systems  Review of Systems  Unable to perform ROS: Dementia     ____________________________________________   PHYSICAL EXAM:  VITAL SIGNS: ED Triage Vitals  Enc Vitals Group     BP      Pulse      Resp      Temp      Temp src      SpO2       Weight      Height      Head Circumference      Peak Flow      Pain Score      Pain Loc      Pain Edu?      Excl. in GC?    Vitals:   09/27/21 1019 09/27/21 1125  BP: (!) 157/98 (!) 163/83  Pulse:  78  Resp:  16  Temp: 98 F (36.7 C) 98.5 F (36.9 C)  SpO2: 98% 97%   Physical Exam Vitals and nursing note reviewed.  Constitutional:      General: She is not in acute distress.    Appearance: She is well-developed. She is ill-appearing.  HENT:     Head: Normocephalic.     Mouth/Throat:     Mouth: Mucous membranes are dry.  Eyes:     Conjunctiva/sclera: Conjunctivae normal.  Cardiovascular:     Rate and Rhythm: Normal rate and regular rhythm.     Heart sounds: No murmur heard. Pulmonary:     Effort: Pulmonary effort is normal. No respiratory distress.     Breath sounds: Normal breath sounds.  Abdominal:     Palpations: Abdomen is soft.     Tenderness: There is no abdominal tenderness.  Musculoskeletal:  General: No swelling.     Cervical back: Neck supple.  Skin:    General: Skin is warm and dry.     Capillary Refill: Capillary refill takes less than 2 seconds.  Neurological:     Mental Status: She is alert.  Psychiatric:        Mood and Affect: Mood normal.    Approximately 1 cm hemostatic linear laceration over the posterior scalp.  No other obvious trauma to the face scalp head or neck.  Patient is able to give this examiner thumbs up with both hands and wiggle her toes on command.  He does not otherwise follow commands in extremities.  PERRLA.  EOMI.  Oropharynx is unremarkable.  No tenderness with bilateral shoulders, elbows, wrists, hips, knees or ankles or over the C/T/L-spine.  Abdomen is soft. ____________________________________________   LABS (all labs ordered are listed, but only abnormal results are displayed)  Labs Reviewed  COMPREHENSIVE METABOLIC PANEL - Abnormal; Notable for the following components:      Result Value   Glucose, Bld 144  (*)    Calcium 8.3 (*)    Total Protein 6.1 (*)    Albumin 3.0 (*)    Total Bilirubin 1.7 (*)    All other components within normal limits  CBG MONITORING, ED - Abnormal; Notable for the following components:   Glucose-Capillary 141 (*)    All other components within normal limits  RESP PANEL BY RT-PCR (FLU A&B, COVID) [id number]  CBC WITH DIFFERENTIAL/PLATELET  PROTIME-INR  URINALYSIS, COMPLETE (UACMP) WITH MICROSCOPIC  TROPONIN I (HIGH SENSITIVITY)  TROPONIN I (HIGH SENSITIVITY)   ____________________________________________  EKG  A. fib versus significant artifact with a rate of 127 borderline uninterpretable in multiple leads secondary to patient shaking.  Repeat EKG shows a rate of 77 and is missing V5 and has fairly significant artifact in lead I and aVL as well as lead III but clearly shows sinus rhythm I have a much lower suspicion initial ECG showed A. fib.  Otherwise unremarkable intervals without evidence of acute ischemia. ____________________________________________  RADIOLOGY  ED MD interpretation:    CT head shows no evidence of skull fracture, intracranial hemorrhage or other clear acute intracranial process.  There is some soft tissue swelling about the right parietal scalp and some chronic intracranial changes without any other acute process noted.  CT C-spine-chronic degenerative change noted without evidence of acute fracture or acute subluxation.  Chest x-ray has no evidence of rib fracture, pneumothorax, effusion, edema or other acute thoracic process.  Pelvis x-ray has no evidence of acute fracture or dislocation.  There are some bilateral hip osteoarthritis noted.   Official radiology report(s): CT HEAD WO CONTRAST ( )  Result Date: 09/27/2021 CLINICAL DATA:  Head trauma EXAM: CT HEAD WITHOUT CONTRAST TECHNIQUE: Contiguous axial images were obtained from the base of the skull through the vertex without intravenous contrast. COMPARISON:  None. FINDINGS:  Brain: No acute intracranial hemorrhage, mass effect, or herniation. No extra-axial fluid collections. No evidence of acute territorial infarct. No hydrocephalus. Mild cortical volume loss. Patchy hypodensities throughout the periventricular and subcortical white matter, likely secondary to chronic microvascular ischemic changes. Small likely old lacunar infarct in the left thalamus. Vascular: Calcified plaques in the carotid siphons. Skull: Normal. Negative for fracture or focal lesion. Sinuses/Orbits: No acute finding. Other: Mild soft tissue swelling in the right parietal scalp. IMPRESSION: Chronic changes with no acute intracranial process identified. Mild soft tissue swelling of the right parietal scalp. Electronically Signed   By:  Jannifer Hick M.D.   On: 09/27/2021 10:28   CT Cervical Spine Wo Contrast  Result Date: 09/27/2021 CLINICAL DATA:  Neck trauma EXAM: CT CERVICAL SPINE WITHOUT CONTRAST TECHNIQUE: Multidetector CT imaging of the cervical spine was performed without intravenous contrast. Multiplanar CT image reconstructions were also generated. COMPARISON:  None. FINDINGS: Alignment: Grade 1 anterolisthesis of C3 on C4, C4 on C5 and C6 on C7. No acute subluxation. Skull base and vertebrae: No acute fracture. No primary bone lesion or focal pathologic process. Soft tissues and spinal canal: No prevertebral fluid or swelling. No visible canal hematoma. Disc levels: Severe intervertebral disc height loss at C5-C6 with prominent dorsal endplate osteophytes. Uncovertebral spurring at C4-C5 and C5-C6. Facet arthropathy throughout the cervical spine. Multilevel bilateral neural foraminal narrowing. Upper chest: No acute process identified. Other: Calcified plaques in the extracranial internal carotid arteries. IMPRESSION: Chronic degenerative changes with no acute fracture or subluxation identified. Electronically Signed   By: Jannifer Hick M.D.   On: 09/27/2021 10:33   DG Pelvis  Portable  Result Date: 09/27/2021 CLINICAL DATA:  87 year old female with history of trauma from a fall. EXAM: PORTABLE PELVIS 1-2 VIEWS COMPARISON:  No priors. FINDINGS: Lucency traversing the left femoral neck appears to represent a prominent skin fold. No definite acute displaced fracture, subluxation or dislocation identified on this single view examination. Joint space narrowing, subchondral sclerosis, subchondral cyst formation and osteophyte formation noted in the hip joints bilaterally, indicative of moderate osteoarthritis. IMPRESSION: 1. No acute radiographic abnormality of the bony pelvis. 2. Moderate bilateral hip joint osteoarthritis. Electronically Signed   By: Trudie Reed M.D.   On: 09/27/2021 11:26   DG Chest Portable 1 View  Result Date: 09/27/2021 CLINICAL DATA:  Fall. EXAM: PORTABLE CHEST 1 VIEW COMPARISON:  None. FINDINGS: Mild enlargement of the cardiac silhouette. Aortic calcifications. No focal consolidation, pleural effusion, or pneumothorax. Calcified granuloma in the right lung base. Diffuse osteopenia. Marked osteoarthritis of the glenohumeral joints. IMPRESSION: No acute cardiopulmonary abnormality. Aortic Atherosclerosis (ICD10-I70.0). Electronically Signed   By: Sherron Ales M.D.   On: 09/27/2021 11:10    ____________________________________________   PROCEDURES  Procedure(s) performed (including Critical Care):  Marland KitchenMarland KitchenLaceration Repair  Date/Time: 09/27/2021 11:42 AM Performed by: Gilles Chiquito, MD Authorized by: Gilles Chiquito, MD   Consent:    Consent obtained:  Verbal   Consent given by:  Healthcare agent   Risks, benefits, and alternatives were discussed: yes     Alternatives discussed:  No treatment Universal protocol:    Procedure explained and questions answered to patient or proxy's satisfaction: yes   Laceration details:    Location:  Scalp   Length (cm):  1 Exploration:    Limited defect created (wound extended): no   Treatment:     Area cleansed with:  Saline   Amount of cleaning:  Standard Skin repair:    Repair method:  Staples   Number of staples:  2 Approximation:    Approximation:  Close Repair type:    Repair type:  Simple Post-procedure details:    Dressing:  Open (no dressing)   Procedure completion:  Tolerated well, no immediate complications   ____________________________________________   INITIAL IMPRESSION / ASSESSMENT AND PLAN / ED COURSE        Patient presents with above-stated history and exam for assessment after an unwitnessed fall.  She was found on the floor at her nursing facility.  Very limited history on arrival from patient, EMS arrived unable to reach staff  or family.  On arrival patient afebrile hemodynamically stable.  She is certainly altered but I am unsure if this is off baseline or not.  She does have a small laceration of her posterior scalp.  With regard to possible traumatic injuries differential considerations include possible skull fracture, intracranial hemorrhage and occult C-spine injury.  CT head shows no evidence of skull fracture, intracranial hemorrhage or other clear acute intracranial process.  There is some soft tissue swelling about the right parietal scalp and some chronic intracranial changes without any other acute process noted.  CT C-spine-chronic degenerative change noted without evidence of acute fracture or acute subluxation.  Chest x-ray has no evidence of rib fracture, pneumothorax, effusion, edema or other acute thoracic process.  Pelvis x-ray has no evidence of acute fracture or dislocation.  There are some bilateral hip osteoarthritis noted.  ECG and nonelevated troponin x2 are not suggestive of ACS or myocarditis. CMP shows no significant electrolyte or metabolic derangements.  CBC without leukocytosis or acute anemia.  INR is unremarkable.  Overall there is no obvious source of infection on exam and per family who did later arrived at bedside this  seems to be a recurrent issue with patient continue to get out of bed and ambulate without assistance.  Given stable vitals otherwise reassuring exam work-up low suspicion for other significant occult or visceral injury I think she is stable for discharge back to nursing facility.  Discharged in stable condition.  Strict return precautions advised in writing.      ____________________________________________   FINAL CLINICAL IMPRESSION(S) / ED DIAGNOSES  Final diagnoses:  Fall, initial encounter  Laceration of scalp, initial encounter    Medications - No data to display   ED Discharge Orders     None        Note:  This document was prepared using Dragon voice recognition software and may include unintentional dictation errors.    Gilles Chiquito, MD 09/27/21 [phone]

## 2021-10-20 ENCOUNTER — Encounter (RURAL_HEALTH_CENTER): Payer: Self-pay

## 2021-10-20 ENCOUNTER — Telehealth (RURAL_HEALTH_CENTER): Payer: Self-pay

## 2021-10-20 ENCOUNTER — Other Ambulatory Visit (RURAL_HEALTH_CENTER): Payer: Self-pay | Admitting: Family

## 2021-10-20 NOTE — Telephone Encounter (Signed)
Mrs. Sherlyn Lick called and stated that pt is currently in a nursing home is [place], the pt is struggling with a UTI and the staff at the nursing home are telling Mrs. Redmon that they are having trouble treating it due to the pts allergy list contains several of the antibiotics frequently used for this. If you would be able to shed some light on whether the pt is truly allergic or maybe they are more of a contraindication as you know the pt possibly better than they do.  Mrs. Sherlyn Lick said ok to call her back and she can rely message to facility

## 2021-10-20 NOTE — Telephone Encounter (Signed)
Please let Carla Cannon know that looking over her chart it looks like  She did not tolerate Bactrim- made her weak  She had a rash when she was on Keflex and an antifungal at the same time but she has taken Keflex since then with no rash  She tolerates Doxycycline   She tolerates Zithromax  She has nausea with Penicillin but no clear allergy reaction listed  Penni Homans, [facility]

## 2021-10-20 NOTE — Telephone Encounter (Signed)
LVM FOR PT TO [facility]

## 2021-10-20 NOTE — Telephone Encounter (Signed)
Mrs. Sherlyn Lick called back and notified. Requested a letter with this info which I mailed out to her address [address]   [place], [place] [address]

## 2022-11-12 DEATH — deceased

## 2022-11-20 IMAGING — CT CT HEAD W/O CM
4 series · 16 of 47 positions shown, 18 images · non-contrast
Comparison: None.

CLINICAL DATA: Head trauma

EXAM:
CT HEAD WITHOUT CONTRAST
TECHNIQUE: Contiguous axial images were obtained from the base of the skull
through the vertex without intravenous contrast.

[Series 2: head wo · axial · 0.42mm/px · z∈[-126,-6]mm · 7 of 33 slices shown, 9 images]
[im 5/33  brain]
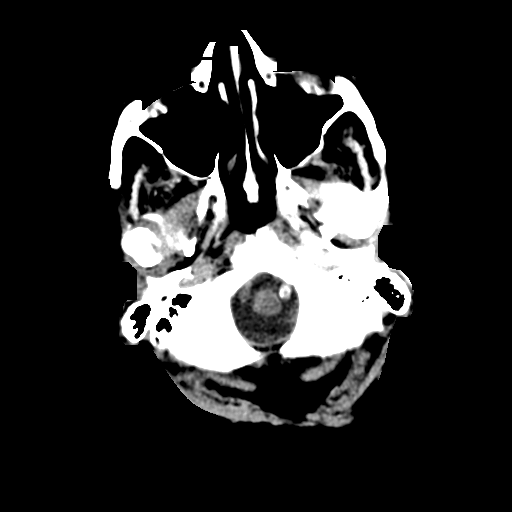
[im 5/33  bone]
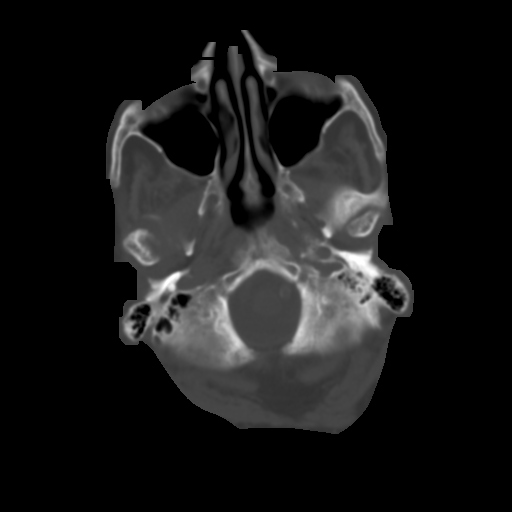
[im 9/33  brain]
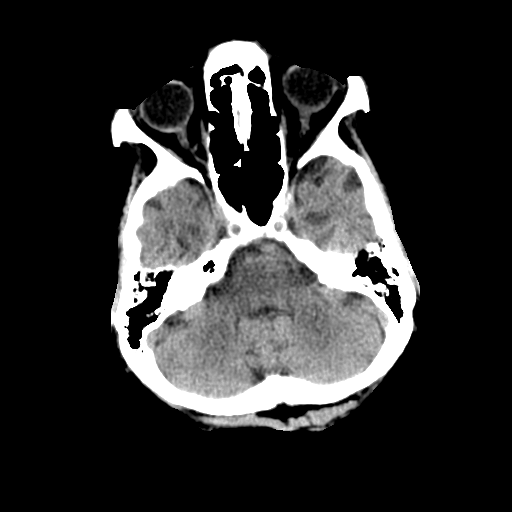
[im 13/33  brain]
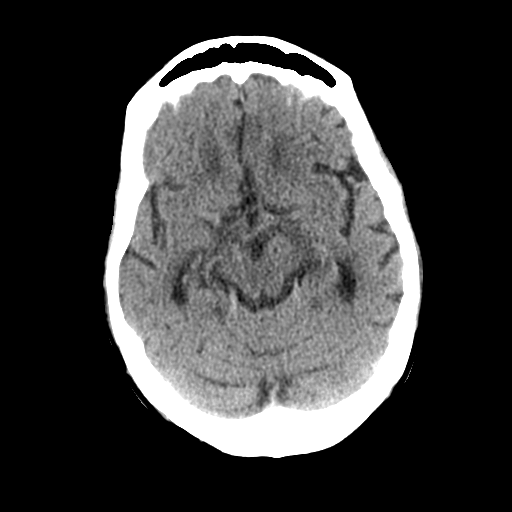
[im 17/33  brain]
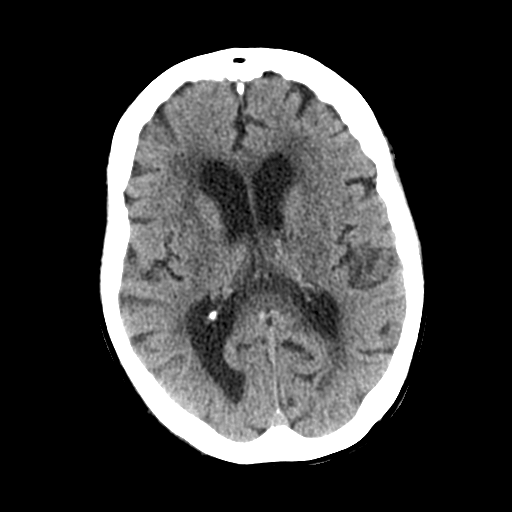
[im 21/33  brain]
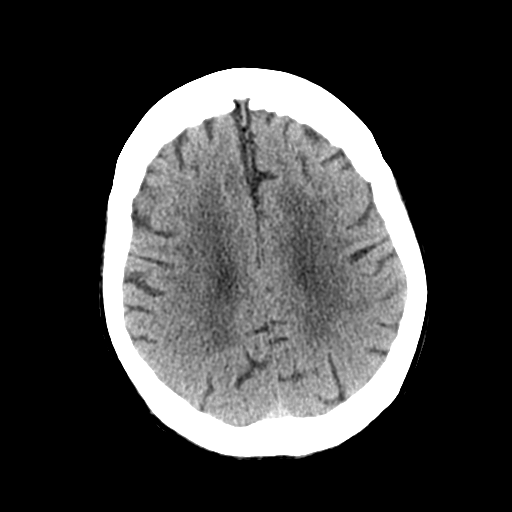
[im 21/33  bone]
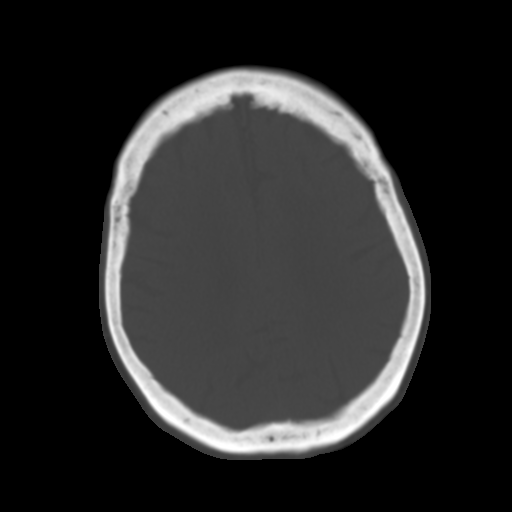
[im 25/33  brain]
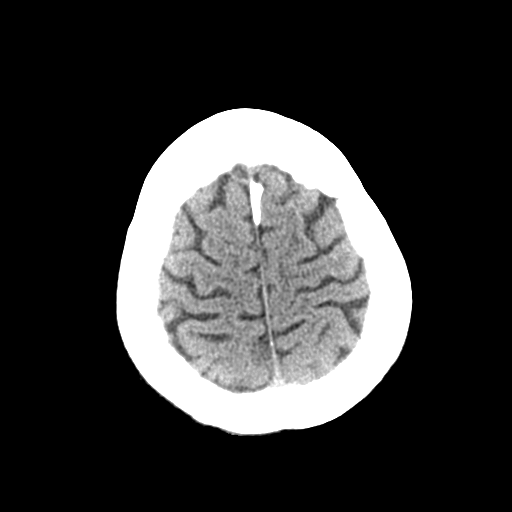
[im 29/33  brain]
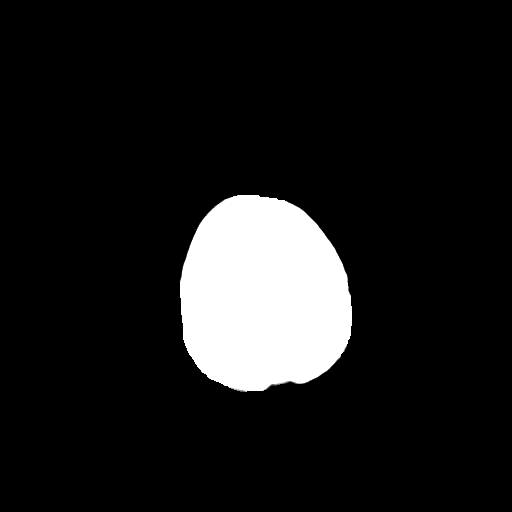

[Series 3: head bone · axial · 0.42mm/px · z∈[-130,-98]mm · 3 of 82 slices shown]
[im 9/82  bone]
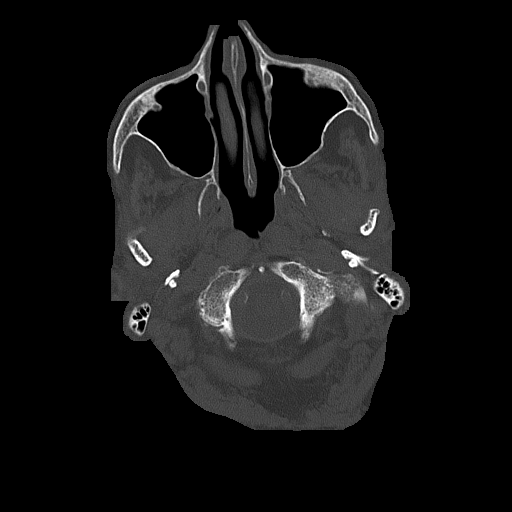
[im 17/82  bone]
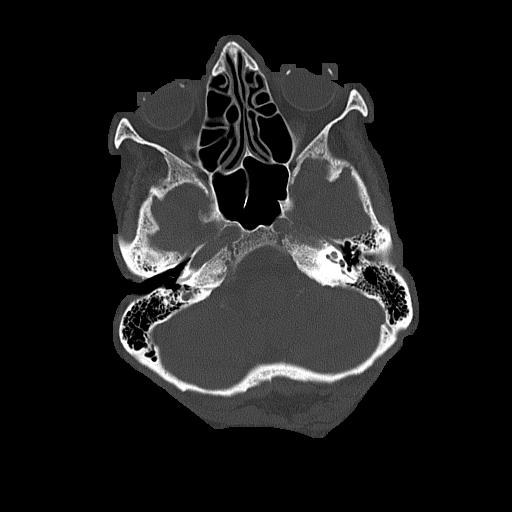
[im 25/82  bone]
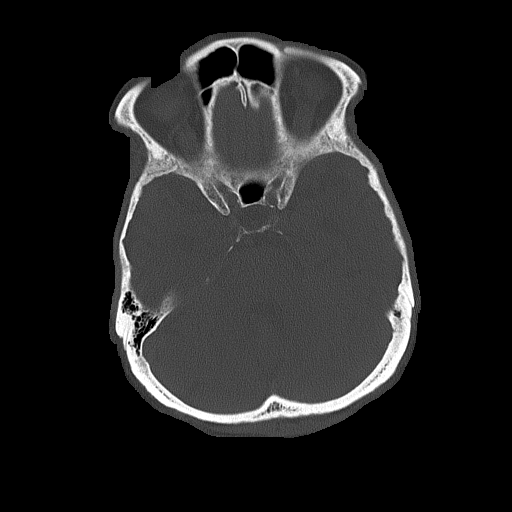

[Series 4: coronal soft tissue · coronal · 0.28mm/px · 3 of 67 slices shown]
[im 23/67  brain]
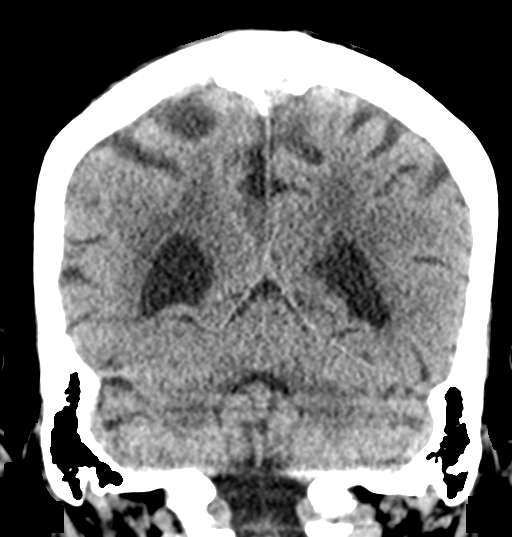
[im 30/67  brain]
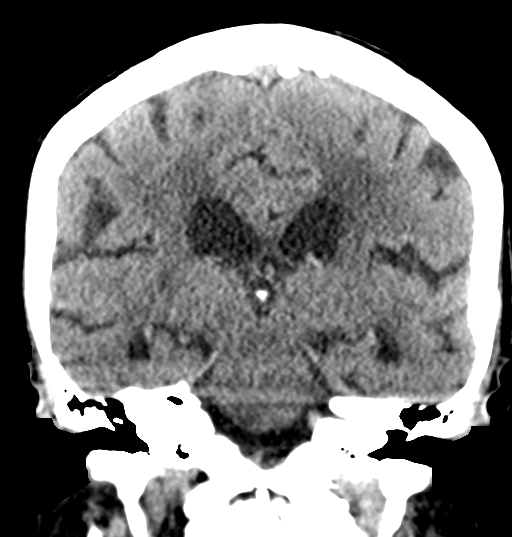
[im 37/67  brain]
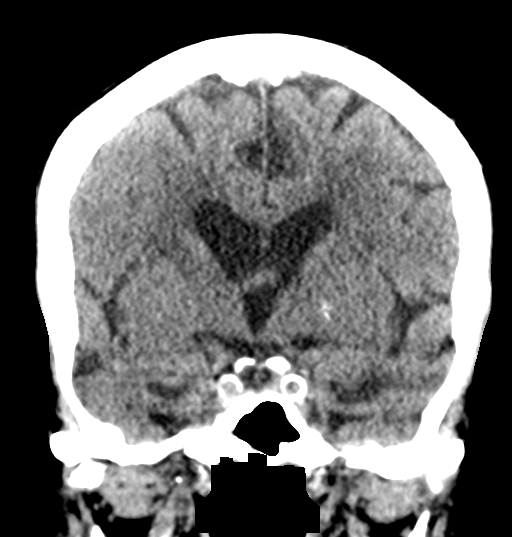

[Series 5: sagittal soft tissue · sagittal · 0.30mm/px · 3 of 49 slices shown]
[im 17/49  brain]
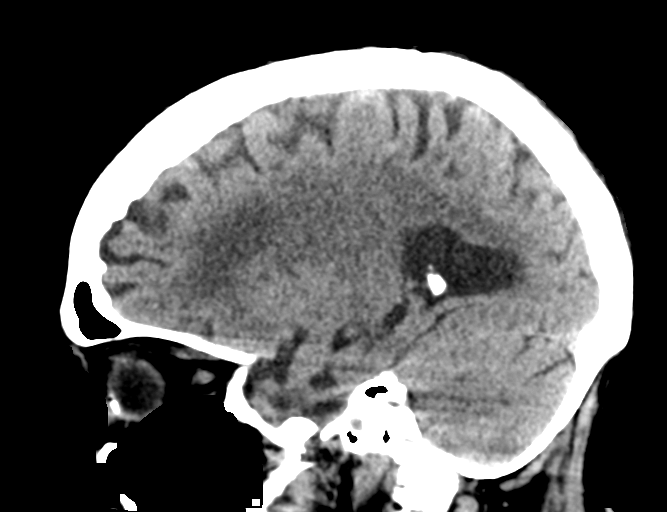
[im 25/49  brain]
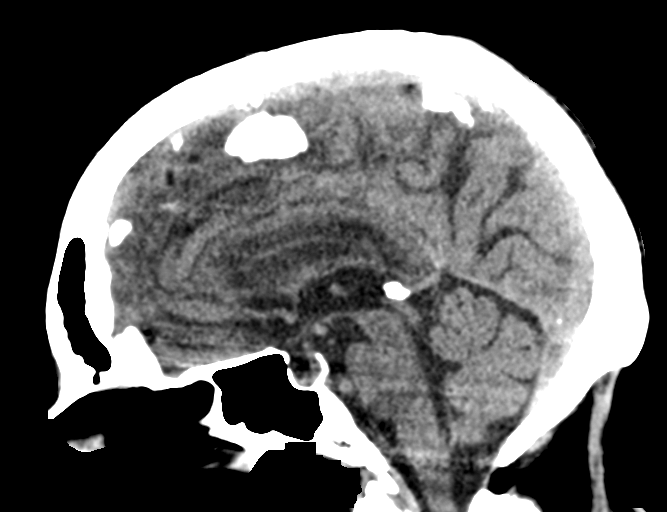
[im 33/49  brain]
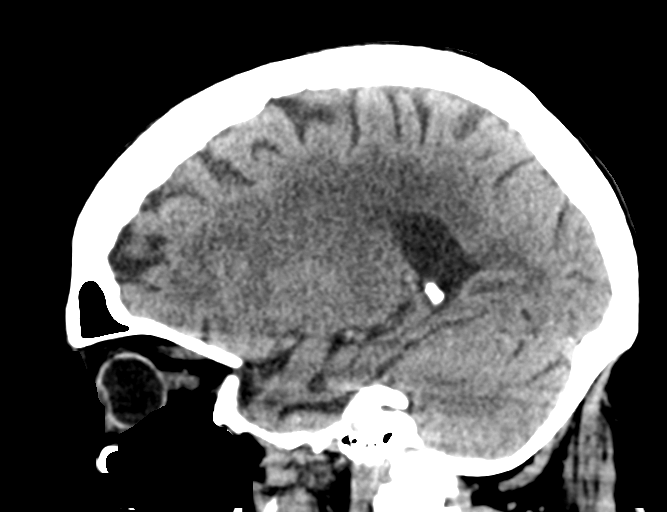

[16 of 47 positions shown; findings below may reference images not displayed]

FINDINGS: Brain: No acute intracranial hemorrhage, mass effect, or herniation.
No extra-axial fluid collections. No evidence of acute territorial
infarct. No hydrocephalus. Mild cortical volume loss. Patchy
hypodensities throughout the periventricular and subcortical white
matter, likely secondary to chronic microvascular ischemic changes.
Small likely old lacunar infarct in the left thalamus.

Vascular: Calcified plaques in the carotid siphons.

Skull: Normal. Negative for fracture or focal lesion.

Sinuses/Orbits: No acute finding.

Other: Mild soft tissue swelling in the right parietal scalp.
IMPRESSION: Chronic changes with no acute intracranial process identified. Mild
soft tissue swelling of the right parietal scalp.

## 2022-11-20 IMAGING — CT CT CERVICAL SPINE W/O CM
3 of 4 series · 12 of 34 positions shown, 14 images · non-contrast
Comparison: None.

CLINICAL DATA: Neck trauma

EXAM:
CT CERVICAL SPINE WITHOUT CONTRAST
TECHNIQUE: Multidetector CT imaging of the cervical spine was performed without
intravenous contrast. Multiplanar CT image reconstructions were also
generated.

[Series 4: sagittal bone · sagittal · 0.37mm/px · 5 of 90 slices shown, 6 images]
[im 30/90  bone]
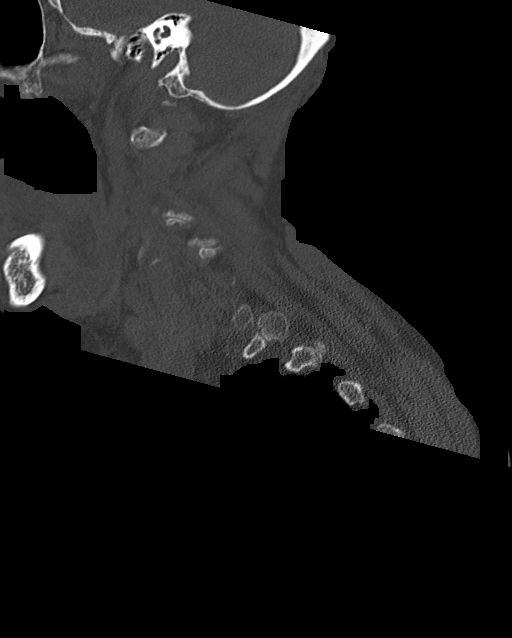
[im 38/90  bone]
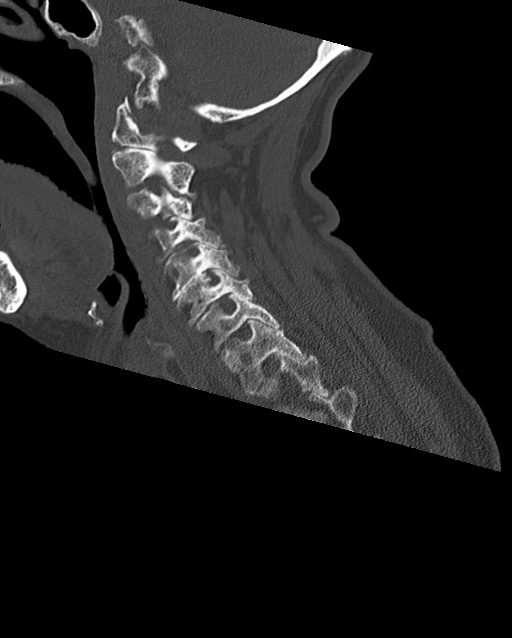
[im 45/90  soft-tissue]
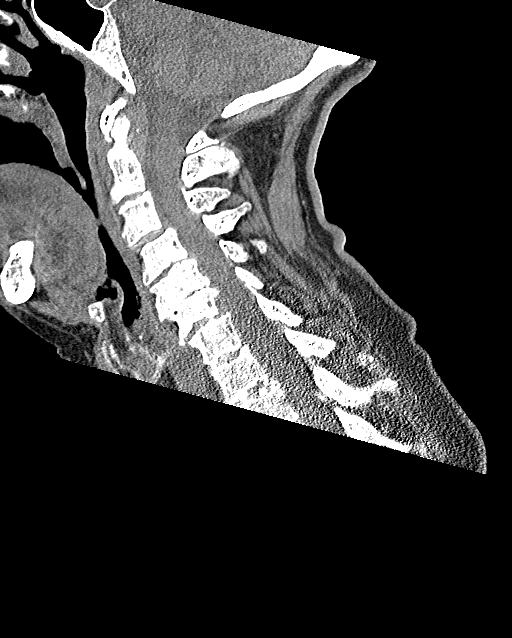
[im 45/90  bone]
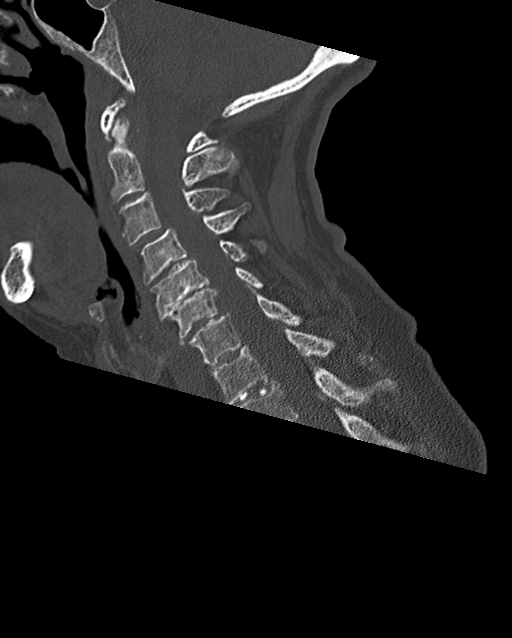
[im 52/90  bone]
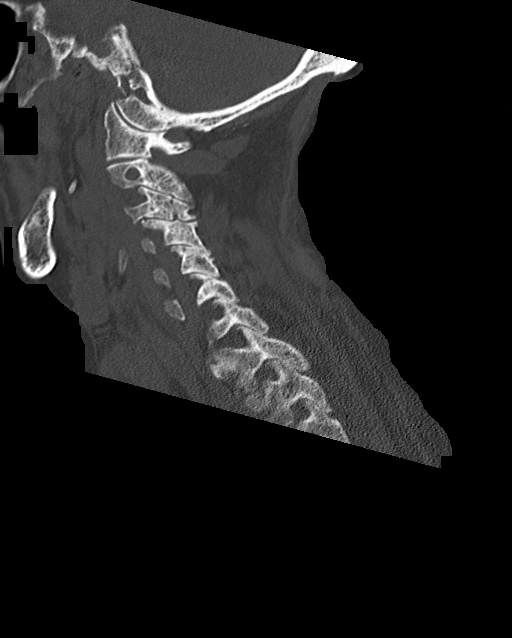
[im 60/90  bone]
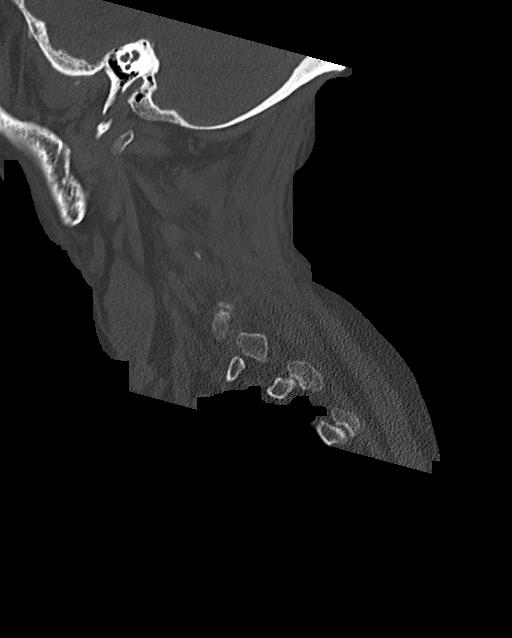

[Series 5: coronal bone · coronal · 0.31mm/px · 3 of 141 slices shown]
[im 29/141  bone]
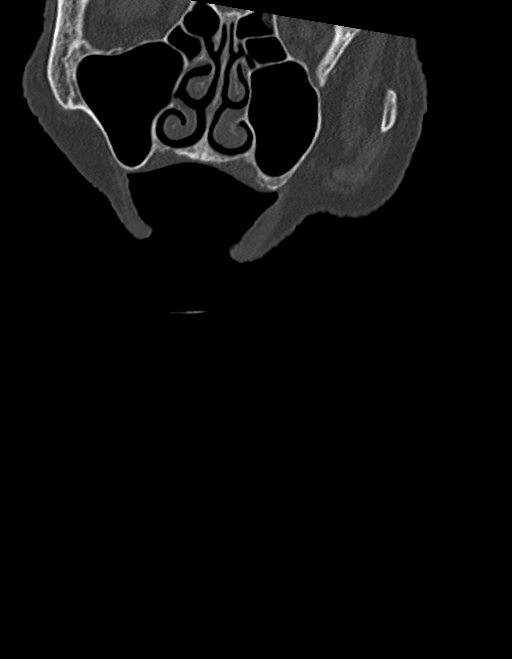
[im 57/141  bone]
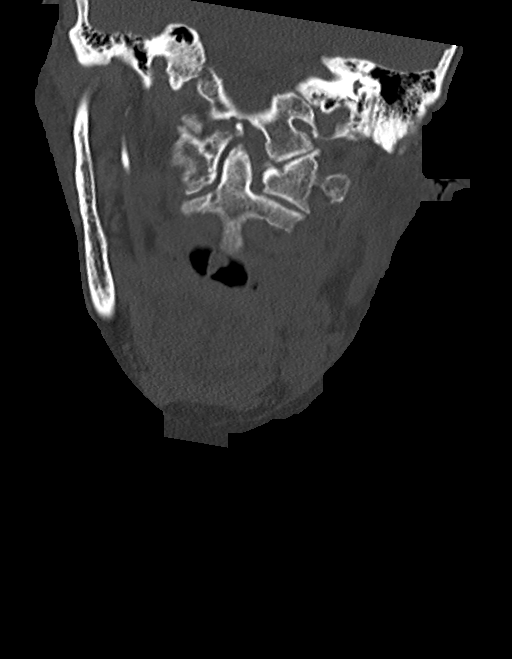
[im 85/141  bone]
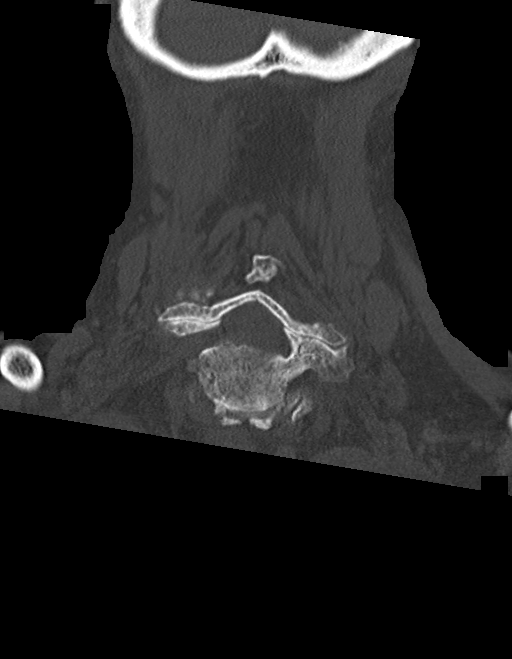

[Series 6: orthogonal bone · axial · 0.35mm/px · z∈[-311,-178]mm · 4 of 99 slices shown, 5 images]
[im 15/99  soft-tissue]
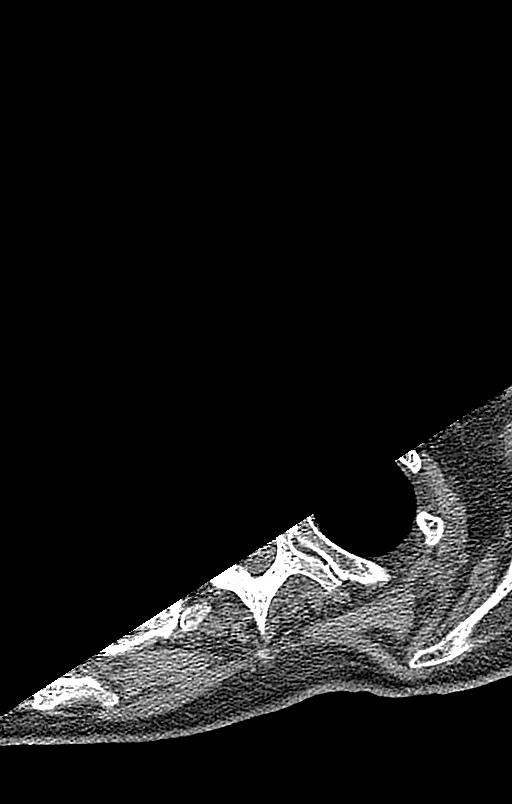
[im 15/99  bone]
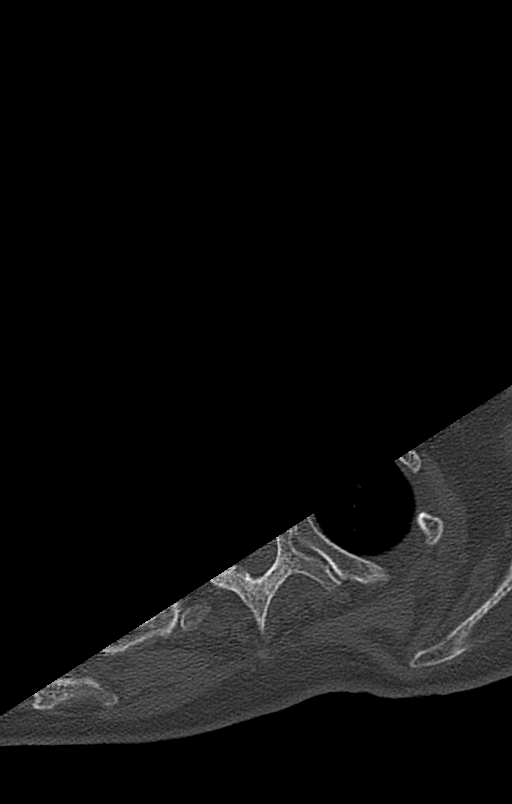
[im 43/99  bone]
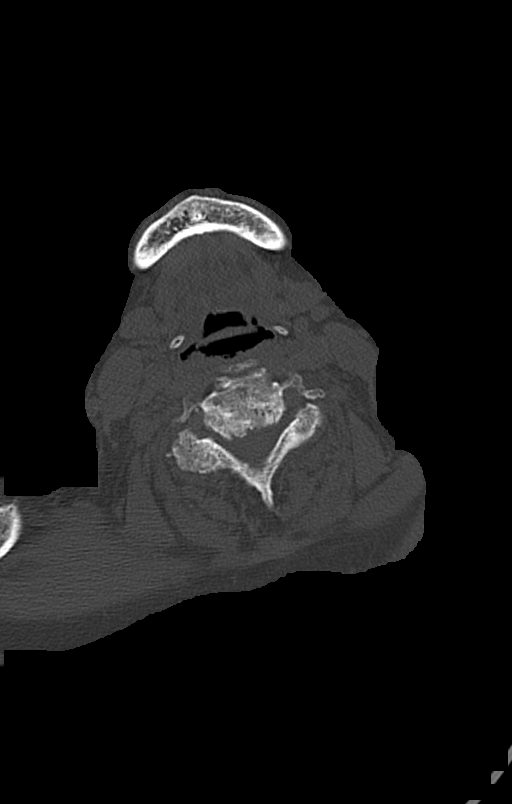
[im 57/99  bone]
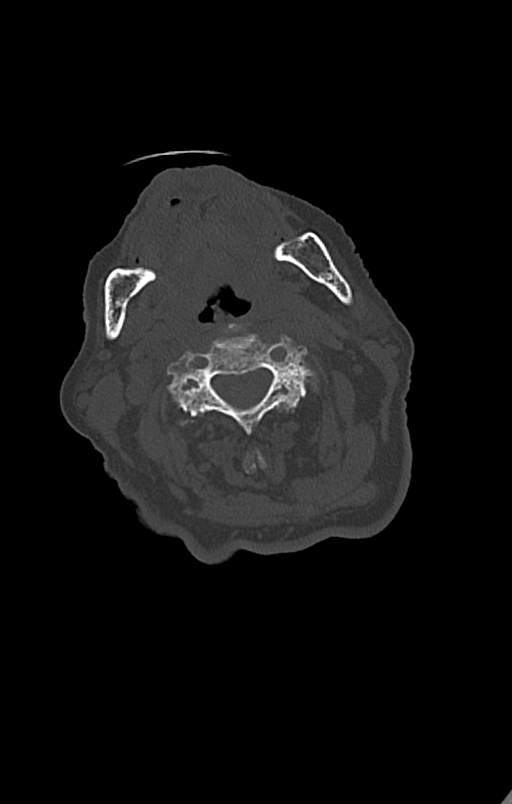
[im 85/99  bone]
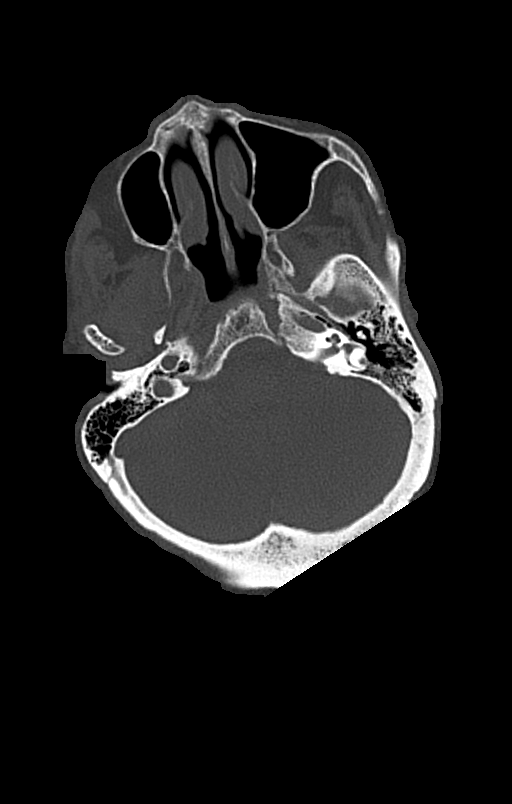

[12 of 34 positions shown; findings below may reference images not displayed]

FINDINGS: Alignment: Grade 1 anterolisthesis of C3 on C4, C4 on C5 and C6 on
C7. No acute subluxation.

Skull base and vertebrae: No acute fracture. No primary bone lesion
or focal pathologic process.

Soft tissues and spinal canal: No prevertebral fluid or swelling. No
visible canal hematoma.

Disc levels: Severe intervertebral disc height loss at C5-C6 with
prominent dorsal endplate osteophytes. Uncovertebral spurring at
C4-C5 and C5-C6. Facet arthropathy throughout the cervical spine.
Multilevel bilateral neural foraminal narrowing.

Upper chest: No acute process identified.

Other: Calcified plaques in the extracranial internal carotid
arteries.
IMPRESSION: Chronic degenerative changes with no acute fracture or subluxation
identified.

## 2022-11-20 IMAGING — CR DG PORTABLE PELVIS
1 series · 1 of 1 positions shown · non-contrast
Comparison: No priors.

CLINICAL DATA: [AGE] female with history of trauma from a
fall.

EXAM:
PORTABLE PELVIS 1-2 VIEWS

[view not recorded]
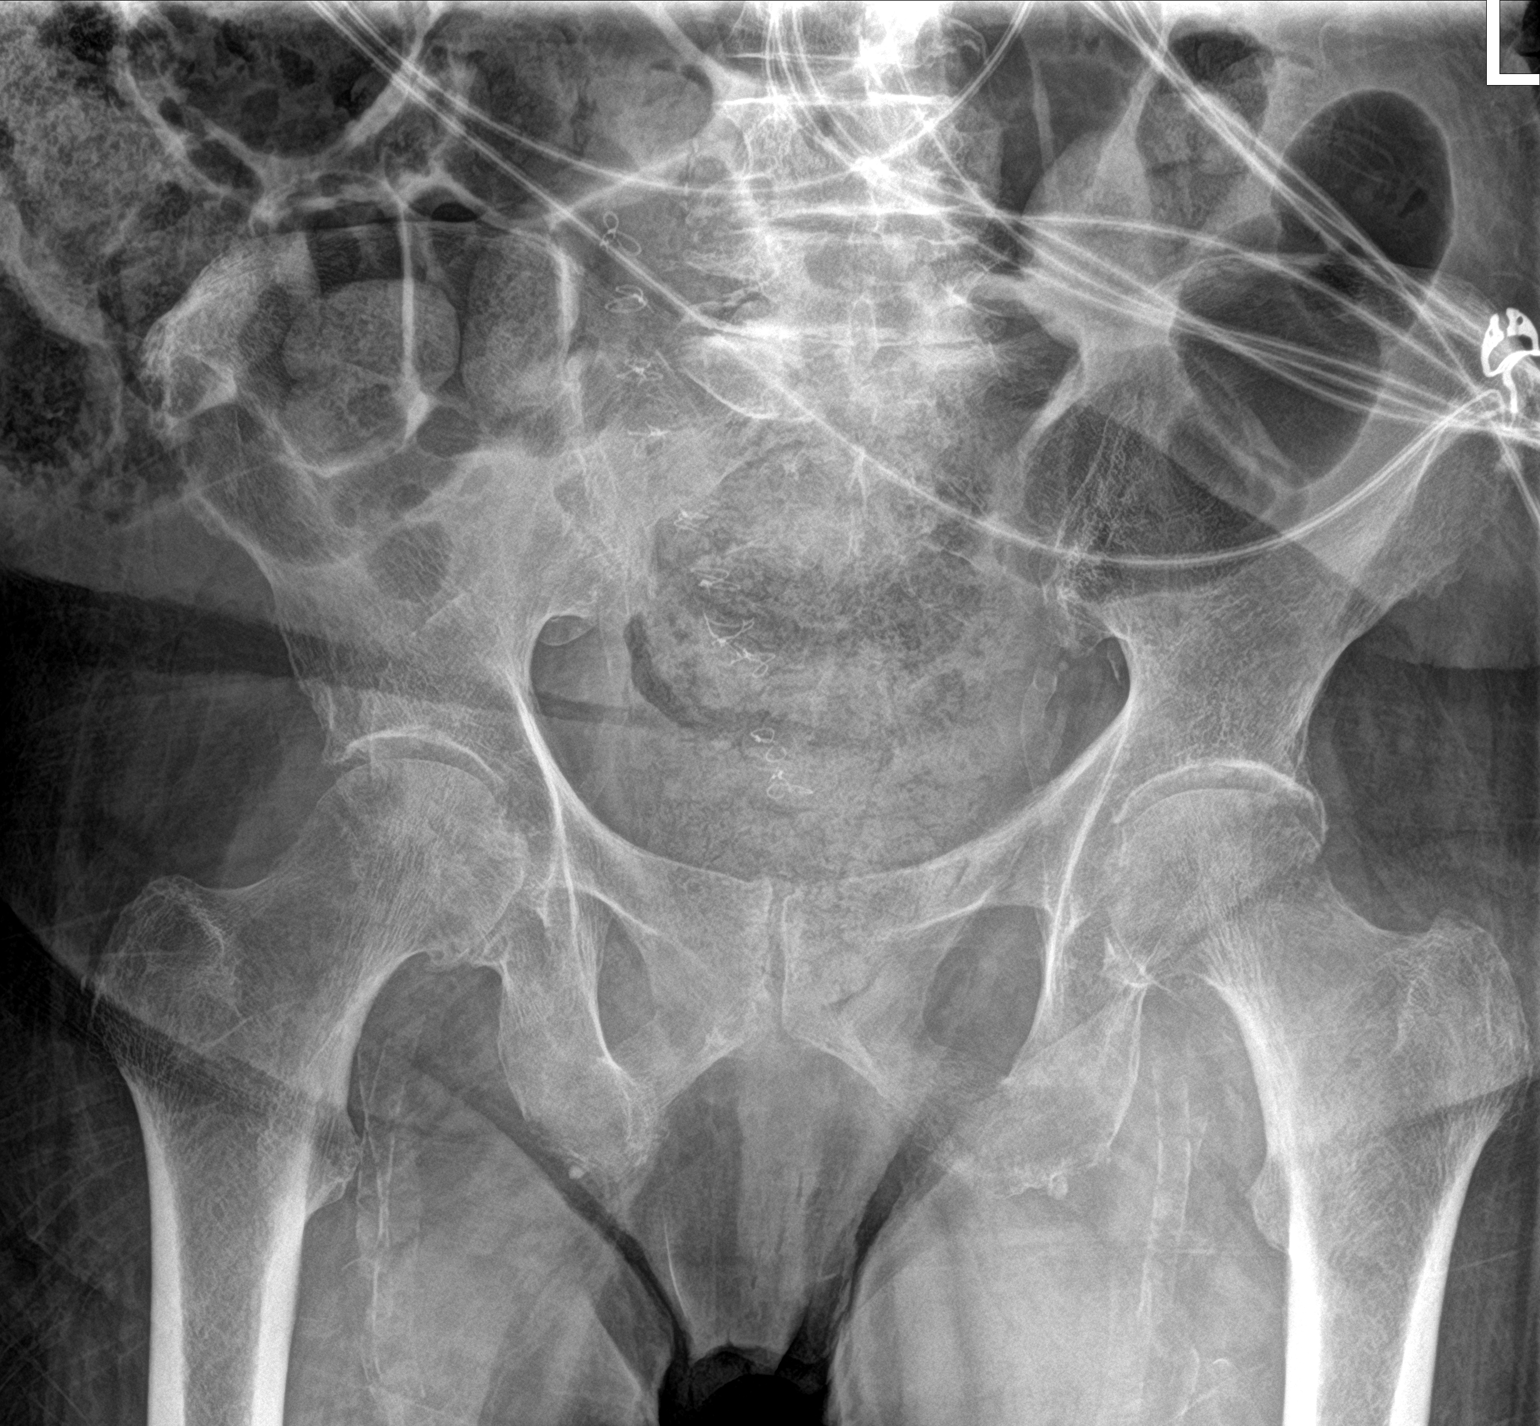

[1 of 1 positions shown; findings below may reference images not displayed]

FINDINGS: Lucency traversing the left femoral neck appears to represent a
prominent skin fold. No definite acute displaced fracture,
subluxation or dislocation identified on this single view
examination. Joint space narrowing, subchondral sclerosis,
subchondral cyst formation and osteophyte formation noted in the hip
joints bilaterally, indicative of moderate osteoarthritis.
IMPRESSION: 1. No acute radiographic abnormality of the bony pelvis.
2. Moderate bilateral hip joint osteoarthritis.
